# Patient Record
Sex: Male | Born: 1975 | Race: Black or African American | Hispanic: No | Marital: Married | Smoking: Current every day smoker
Health system: Southern US, Community
[De-identification: ages and names within clinical notes are randomized; demographics above are authoritative.]

## PROBLEM LIST (undated history)

## (undated) DIAGNOSIS — I10 Essential (primary) hypertension: Secondary | ICD-10-CM

## (undated) HISTORY — PX: KNEE SURGERY: SHX244

---

## 2001-04-27 ENCOUNTER — Emergency Department (HOSPITAL_COMMUNITY): Admission: EM | Admit: 2001-04-27 | Discharge: 2001-04-27 | Payer: Self-pay | Admitting: Emergency Medicine

## 2001-04-28 ENCOUNTER — Encounter: Payer: Self-pay | Admitting: Emergency Medicine

## 2007-04-29 ENCOUNTER — Emergency Department (HOSPITAL_COMMUNITY): Admission: EM | Admit: 2007-04-29 | Discharge: 2007-04-29 | Payer: Self-pay | Admitting: Emergency Medicine

## 2008-11-03 ENCOUNTER — Emergency Department (HOSPITAL_COMMUNITY): Admission: EM | Admit: 2008-11-03 | Discharge: 2008-11-03 | Payer: Self-pay | Admitting: Emergency Medicine

## 2009-07-28 ENCOUNTER — Emergency Department (HOSPITAL_COMMUNITY): Admission: EM | Admit: 2009-07-28 | Discharge: 2009-07-28 | Payer: Self-pay | Admitting: Emergency Medicine

## 2009-10-06 ENCOUNTER — Emergency Department (HOSPITAL_COMMUNITY): Admission: EM | Admit: 2009-10-06 | Discharge: 2009-10-06 | Payer: Self-pay | Admitting: Emergency Medicine

## 2010-10-21 LAB — COMPREHENSIVE METABOLIC PANEL
ALT: 17 U/L (ref 0–53)
AST: 23 U/L (ref 0–37)
Albumin: 4.2 g/dL (ref 3.5–5.2)
Alkaline Phosphatase: 61 U/L (ref 39–117)
BUN: 8 mg/dL (ref 6–23)
CO2: 29 mEq/L (ref 19–32)
Calcium: 9.5 mg/dL (ref 8.4–10.5)
Chloride: 101 mEq/L (ref 96–112)
Creatinine, Ser: 0.88 mg/dL (ref 0.4–1.5)
GFR calc Af Amer: >60 mL/min (ref >60)
GFR calc non Af Amer: >60 mL/min (ref >60)
Glucose, Bld: 112 mg/dL — ABNORMAL HIGH (ref 70–99)
Potassium: 4.7 mEq/L (ref 3.5–5.1)
Sodium: 139 mEq/L (ref 135–145)
Total Bilirubin: 1.6 mg/dL — ABNORMAL HIGH (ref 0.3–1.2)
Total Protein: 7.3 g/dL (ref 6.0–8.3)

## 2011-05-22 ENCOUNTER — Encounter: Payer: Self-pay | Admitting: Emergency Medicine

## 2011-05-22 ENCOUNTER — Emergency Department (HOSPITAL_COMMUNITY)
Admission: EM | Admit: 2011-05-22 | Discharge: 2011-05-22 | Disposition: A | Discharge status: Home / Self Care | Payer: Medicaid Other | Attending: Emergency Medicine | Admitting: Emergency Medicine

## 2011-05-22 DIAGNOSIS — J329 Chronic sinusitis, unspecified: Secondary | ICD-10-CM | POA: Insufficient documentation

## 2011-05-22 DIAGNOSIS — J029 Acute pharyngitis, unspecified: Secondary | ICD-10-CM | POA: Insufficient documentation

## 2011-05-22 DIAGNOSIS — F411 Generalized anxiety disorder: Secondary | ICD-10-CM | POA: Insufficient documentation

## 2011-05-22 DIAGNOSIS — J3489 Other specified disorders of nose and nasal sinuses: Secondary | ICD-10-CM | POA: Insufficient documentation

## 2011-05-22 DIAGNOSIS — F419 Anxiety disorder, unspecified: Secondary | ICD-10-CM

## 2011-05-22 MED ORDER — AZITHROMYCIN 250 MG PO TABS
500.0000 mg | ORAL_TABLET | Freq: Once | ORAL | Status: AC
  Administered 2011-05-22: 500 mg via ORAL
  Filled 2011-05-22: qty 2

## 2011-05-22 MED ORDER — AZITHROMYCIN 250 MG PO TABS: ORAL_TABLET | ORAL | Status: AC

## 2011-05-22 MED ORDER — KETOROLAC TROMETHAMINE 30 MG/ML IJ SOLN
60.0000 mg | Freq: Once | INTRAMUSCULAR | Status: AC
  Administered 2011-05-22: 60 mg via INTRAMUSCULAR

## 2011-05-22 MED ORDER — LORAZEPAM 1 MG PO TABS: 1.0000 mg | ORAL_TABLET | Freq: Three times a day (TID) | ORAL | Status: AC | PRN

## 2011-05-22 MED ORDER — OXYMETAZOLINE HCL 0.05 % NA SOLN
2.0000 | Freq: Once | NASAL | Status: AC
  Administered 2011-05-22: 2 via NASAL
  Filled 2011-05-22: qty 15

## 2011-05-22 MED ORDER — LORAZEPAM 1 MG PO TABS
1.0000 mg | ORAL_TABLET | Freq: Once | ORAL | Status: AC
  Administered 2011-05-22: 1 mg via ORAL
  Filled 2011-05-22: qty 1

## 2011-05-22 MED ORDER — KETOROLAC TROMETHAMINE 60 MG/2ML IM SOLN
INTRAMUSCULAR | Status: AC
  Filled 2011-05-22: qty 2

## 2011-05-22 NOTE — ED Provider Notes (Signed)
History     CSN: [id number] Arrival date & time: 05/22/2011  1:46 AM   First MD Initiated Contact with Patient 05/22/11 0243      No chief complaint on file.   (Consider location/radiation/quality/duration/timing/severity/associated sxs/prior treatment) HPI The patient is a 35 year old male who presents today with his mother for evaluation of sinus congestion. He's had this for the past 7 days. Patient is very anxious on presentation states that he cannot breathe. Mom immediately notifies me that the patient lost his son approximately one week ago. He's had an upper respiratory tract infections of that time. Patient is very congested appearing with erythema around the naris. He does complain of some sore throat as well. He denies cough. He has not had any fevers. Patient has tried Benadryl as well as saline nasal spray at home without relief. He's been taking Tylenol and ibuprofen as well. Patient does continuously snort up his mucus live in the room. When advised that he should blow this out the patient states that he can't because it hurts too bad. Patient denies any chest pain or shortness of breath. He is very anxious and irritable. There are no other associated or modifying factors. History reviewed. No pertinent past medical history.  History reviewed. No pertinent past surgical history.  History reviewed. No pertinent family history.  History  Substance Use Topics  . Smoking status: Not on file  . Smokeless tobacco: Not on file  . Alcohol Use: Yes     Normally does not drink, has been drinking everyday since Sat.      Review of Systems  Constitutional: Positive for appetite change and fatigue.  HENT: Positive for congestion, rhinorrhea and sinus pressure.   Respiratory: Negative.   Cardiovascular: Negative.   Gastrointestinal: Negative.   Genitourinary: Negative.   Musculoskeletal: Negative.   Skin: Negative.   Neurological: Positive for headaches.  Hematological:  Negative.   Psychiatric/Behavioral: Negative.   All other systems reviewed and are negative.    Allergies  Review of patient's allergies indicates no known allergies.  Home Medications   Current Outpatient Rx  Name Route Sig Dispense Refill  . DIPHENHYDRAMINE HCL 25 MG PO CAPS Oral Take 25 mg by mouth every 6 (six) hours as needed. Nasal Congestion     . COLD AND FLU PO Oral Take 30 mLs by mouth daily.      Marland Kitchen SALINE NASAL SPRAY 0.65 % NA SOLN Nasal Place 1 spray into the nose as needed. Nasal Congestion     . AZITHROMYCIN 250 MG PO TABS  Take 1 tab po qday for 4 days. Begin 11/11. 4 each 0  . LORAZEPAM 1 MG PO TABS Oral Take 1 tablet (1 mg total) by mouth every 8 (eight) hours as needed for anxiety. 20 tablet 0    BP 130/71  Pulse 69  Temp(Src) 97.7 F (36.5 C) (Oral)  Resp 20  SpO2 99%  Physical Exam  Nursing note and vitals reviewed. Constitutional: He is oriented to person, place, and time. He appears well-developed and well-nourished.       Emotionally upset  HENT:  Head: Normocephalic and atraumatic.  Nose: Mucosal edema and rhinorrhea present. Right sinus exhibits maxillary sinus tenderness and frontal sinus tenderness. Left sinus exhibits maxillary sinus tenderness and frontal sinus tenderness.  Mouth/Throat: Posterior oropharyngeal edema and posterior oropharyngeal erythema present.  Eyes: Conjunctivae and EOM are normal. Pupils are equal, round, and reactive to light.  Neck: Normal range of motion.  Cardiovascular: Normal rate, regular  rhythm, normal heart sounds and intact distal pulses.  Exam reveals no gallop and no friction rub.   No murmur heard. Pulmonary/Chest: Effort normal and breath sounds normal. No respiratory distress. He has no wheezes. He has no rales.  Abdominal: Soft. Bowel sounds are normal. He exhibits no distension. There is no tenderness. There is no rebound and no guarding.  Musculoskeletal: Normal range of motion.  Neurological: He is alert  and oriented to person, place, and time. No cranial nerve deficit. He exhibits normal muscle tone. Coordination normal.  Skin: Skin is warm and dry. No rash noted.  Psychiatric: His mood appears anxious.    ED Course  Procedures (including critical care time)   Labs Reviewed  RAPID STREP SCREEN   No results found.   1. Sinusitis   2. Anxiety       MDM  The patient was evaluated by myself. He was very uncomfortable and also quite anxious. Based on my initial evaluation patient did have a URI with symptoms that persisted for the past 7 days. Though he was afebrile and hemodynamically stable patient had significant amount of discomfort. A rapid strep screen of the throat was sent and this was negative. He was given 60 mg of Toradol IM. Given patient's anxiety he also agreed to receive Ativan 1 mg by mouth. He was given aspirin as well. Following this patient was able to sleep and did state that he felt better. As the symptoms have lasted for 7 days and patient does appearing comfortable he was given a prescription for azithromycin. First dose of 500 mg given here. Also in light of his son's recent death he was given a prescription for a brief course of Ativan. He was advised that he should seek counseling regarding this. He was not suicidal. Patient was discharged home in good condition with his aspirin as well as prescriptions for ibuprofen, azithromycin, and Ativan.        Cyndra Numbers, MD 05/22/11 1003

## 2011-05-22 NOTE — ED Notes (Signed)
rx x 2, pt voiced understanding to f/u with PCP and finish full abx treatment

## 2011-05-22 NOTE — ED Notes (Signed)
Pt c/o head congestion onset approx 5 days ago.  St's nose is stopped up causing him to feel like he can not breath.  St's he has been using saline gtts in nares without relief.  Pt denies cough

## 2011-10-27 ENCOUNTER — Encounter (HOSPITAL_COMMUNITY): Payer: Self-pay | Admitting: Physical Medicine and Rehabilitation

## 2011-10-27 ENCOUNTER — Emergency Department (HOSPITAL_COMMUNITY)
Admission: EM | Admit: 2011-10-27 | Discharge: 2011-10-27 | Disposition: A | Discharge status: Home / Self Care | Payer: Medicaid Other | Attending: Emergency Medicine | Admitting: Emergency Medicine

## 2011-10-27 DIAGNOSIS — T622X1A Toxic effect of other ingested (parts of) plant(s), accidental (unintentional), initial encounter: Secondary | ICD-10-CM | POA: Insufficient documentation

## 2011-10-27 DIAGNOSIS — L255 Unspecified contact dermatitis due to plants, except food: Secondary | ICD-10-CM | POA: Insufficient documentation

## 2011-10-27 DIAGNOSIS — F17200 Nicotine dependence, unspecified, uncomplicated: Secondary | ICD-10-CM | POA: Insufficient documentation

## 2011-10-27 DIAGNOSIS — L237 Allergic contact dermatitis due to plants, except food: Secondary | ICD-10-CM

## 2011-10-27 MED ORDER — HYDROXYZINE HCL 25 MG PO TABS: 25.0000 mg | ORAL_TABLET | Freq: Four times a day (QID) | ORAL | Status: AC

## 2011-10-27 MED ORDER — PREDNISONE 20 MG PO TABS: 60.0000 mg | ORAL_TABLET | Freq: Every day | ORAL | Status: AC

## 2011-10-27 MED ORDER — DEXAMETHASONE SODIUM PHOSPHATE 10 MG/ML IJ SOLN
10.0000 mg | Freq: Once | INTRAMUSCULAR | Status: AC
  Administered 2011-10-27: 10 mg via INTRAMUSCULAR
  Filled 2011-10-27: qty 1

## 2011-10-27 NOTE — ED Notes (Signed)
Pt presents to department for evaluation of rash all over body. 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 Now states small red, itching and painful bumps all over body. Concerned for poison ivy. Pt is alert and oriented x4. Airway intact. Ambulatory to triage. No signs of distress noted at the time.

## 2011-10-27 NOTE — Discharge Instructions (Signed)
Poison Ivy  Poison ivy is a inflammation of the skin (contact dermatitis) caused by touching the allergens on the leaves of the ivy plant following previous exposure to the plant. The rash usually appears 48 hours after exposure. The rash is usually bumps (papules) or blisters (vesicles) in a linear pattern. Depending on your own sensitivity, the rash may simply cause redness and itching, or it may also progress to blisters which may break open. These must be well cared for to prevent secondary bacterial (germ) infection, followed by scarring. Keep any open areas dry, clean, dressed, and covered with an antibacterial ointment if needed. The eyes may also get puffy. The puffiness is worst in the morning and gets better as the day progresses. This dermatitis usually heals without scarring, within 2 to 3 weeks without treatment.  HOME CARE INSTRUCTIONS    Thoroughly wash with soap and water as soon as you have been exposed to poison ivy. You have about one half hour to remove the plant resin before it will cause the rash. This washing will destroy the oil or antigen on the skin that is causing, or will cause, the rash. Be sure to wash under your fingernails as any plant resin there will continue to spread the rash. Do not rub skin vigorously when washing affected area. Poison ivy cannot spread if no oil from the plant remains on your body. A rash that has progressed to weeping sores will not spread the rash unless you have not washed thoroughly. It is also important to wash any clothes you have been wearing as these may carry active allergens. The rash will return if you wear the unwashed clothing, even several days later.  Avoidance of the plant in the future is the best measure. Poison ivy plant can be recognized by the number of leaves. Generally, poison ivy has three leaves with flowering branches on a single stem.   Diphenhydramine may be purchased over the counter and used as needed for itching. Do not drive with this medication if it makes you drowsy.Ask your caregiver about medication for children.  SEEK MEDICAL CARE IF:   Open sores develop.   Redness spreads beyond area of rash.   You notice purulent (pus-like) discharge.   You have increased pain.   Other signs of infection develop (such as fever).  Document Released: 06/25/2000 Document Revised: 06/17/2011 Document Reviewed: 05/14/2009  [employer] Patient Information 2012 [employer]    Poison Oak  Poison oak is an inflammation of the skin (contact dermatitis). It is caused by contact with the allergens on the leaves of the oak (toxicodendron) plants. Depending on your sensitivity, the rash may consist simply of redness and itching, or it may also progress to blisters which may break open (rupture). These must be well cared for to prevent secondary germ (bacterial) infection as these infections can lead to scarring. The eyes may also get puffy. The puffiness is worst in the morning and gets better as the day progresses. Healing is best accomplished by keeping any open areas dry, clean, covered with a bandage, and covered with an antibacterial ointment if needed. Without secondary infection, this dermatitis usually heals without scarring within 2 to 3 weeks without treatment.  HOME CARE INSTRUCTIONS   When you have been exposed to poison oak, it is very important to thoroughly wash with soap and water as soon as the exposure has been discovered. You have about one half hour to remove the plant resin before it will cause the   rash. This cleaning will quickly destroy the oil or antigen on the skin (the antigen is what causes the rash). Wash aggressively under the fingernails as any plant resin still there will continue to spread the rash. Do not rub skin vigorously when washing affected area. Poison oak cannot spread if no oil from the plant remains on your body. Rash that has progressed to weeping sores (lesions) will not spread the rash unless you have not washed thoroughly. It is also important to clean any clothes you have been wearing as they may carry active allergens which will spread the rash, even several days later.  Avoidance of the plant in the future is the best measure. Poison oak plants can be recognized by the number of leaves. Generally, poison oak has three leaves with flowering branches on a single stem.  Diphenhydramine may be purchased over the counter and used as needed for itching. Do not drive with this medication if it makes you drowsy. Ask your caregiver about medication for children.  SEEK IMMEDIATE MEDICAL CARE IF:     Open areas of the rash develop.   You notice redness extending beyond the area of the rash.   There is a pus like discharge.   There is increased pain.   Other signs of infection develop (such as fever).  Document Released: 01/02/2003 Document Revised: 06/17/2011 Document Reviewed: 05/14/2009  [employer] Patient Information 2012 [employer]

## 2011-10-27 NOTE — ED Provider Notes (Signed)
Medical screening examination/treatment/procedure(s) were performed by non-physician practitioner and as supervising physician I was immediately available for consultation/collaboration.   Benny Lennert, MD 10/27/11 [phone]

## 2011-10-27 NOTE — ED Notes (Signed)
Pt discharged home. No further questions.

## 2011-10-27 NOTE — ED Provider Notes (Signed)
History     CSN: [id number]  Arrival date & time 10/27/11  7846   First MD Initiated Contact with Patient 10/27/11 0819      8:33 AM  HPI Patient reports 3 days ago was working in the yard. Reports shortly after developed itchy rash on for head and arms. Denies fever, headache, neck pain. Over-the-counter antihistamine cream is not helping itch. Patient is a 36 y.o. male presenting with Poison Ivy. The history is provided by the patient.  Poison L[place]oyce Corners This is a new problem. The current episode started yesterday (3 days ago). The problem occurs constantly. The problem has been gradually worsening. Associated symptoms include a rash. Pertinent negatives include no chills, fever, headaches, joint swelling, nausea, neck pain or vomiting. Treatments tried: antihistamine cream. The treatment provided no relief.    No past medical history on file.  No past surgical history on file.  History reviewed. No pertinent family history.  History  Substance Use Topics  . Smoking status: Current Everyday Smoker -- 3.0 packs/day for 24 years  . Smokeless tobacco: Not on file  . Alcohol Use: Yes     Normally does not drink, has been drinking everyday since Sat.      Review of Systems  Constitutional: Negative for fever and chills.  HENT: Negative for neck pain and neck stiffness.   Respiratory: Negative for shortness of breath and wheezing.   Gastrointestinal: Negative for nausea and vomiting.  Musculoskeletal: Negative for joint swelling.  Skin: Positive for rash.  Neurological: Negative for dizziness and headaches.  All other systems reviewed and are negative.    Allergies  Review of patient's allergies indicates no known allergies.  Home Medications  No current outpatient prescriptions on file.  BP 126/90  Pulse 68  Temp(Src) 98.2 F (36.8 C) (Oral)  Resp 18  SpO2 97%  Physical Exam  Constitutional: He is oriented to person, place, and time. He appears well-developed and  well-nourished.  HENT:  Head: Normocephalic and atraumatic.  Mouth/Throat: Oropharynx is clear and moist. No oropharyngeal exudate.  Eyes: Pupils are equal, round, and reactive to light.  Cardiovascular: Normal rate, regular rhythm and intact distal pulses.  Exam reveals no gallop and no friction rub.   No murmur heard. Pulmonary/Chest: Effort normal and breath sounds normal. He has no wheezes. He has no rales. He exhibits no tenderness.  Neurological: He is alert and oriented to person, place, and time.  Skin: Skin is warm and dry. Rash (Linear erythematous vesicular lesions on his forehead and bilateral upper extremities.  ) noted. No erythema. No pallor.  Psychiatric: He has a normal mood and affect. His behavior is normal.    ED Course  Procedures   MDM  Patient given IM Decadron, and Prescription for prednisone and hydroxyzine for contact dermatitis due to poison ivy.     Thomasene Lot, PA-C 10/27/11 [phone]

## 2011-12-13 ENCOUNTER — Encounter (HOSPITAL_COMMUNITY): Payer: Self-pay | Admitting: *Deleted

## 2011-12-13 ENCOUNTER — Emergency Department (HOSPITAL_COMMUNITY)
Admission: EM | Admit: 2011-12-13 | Discharge: 2011-12-13 | Disposition: A | Discharge status: Home / Self Care | Payer: Self-pay | Attending: Emergency Medicine | Admitting: Emergency Medicine

## 2011-12-13 DIAGNOSIS — J302 Other seasonal allergic rhinitis: Secondary | ICD-10-CM

## 2011-12-13 DIAGNOSIS — J301 Allergic rhinitis due to pollen: Secondary | ICD-10-CM | POA: Insufficient documentation

## 2011-12-13 MED ORDER — DEXAMETHASONE SODIUM PHOSPHATE 4 MG/ML IJ SOLN
8.0000 mg | Freq: Once | INTRAMUSCULAR | Status: AC
  Administered 2011-12-13: 8 mg via INTRAMUSCULAR
  Filled 2011-12-13: qty 2

## 2011-12-13 MED ORDER — LORATADINE 10 MG PO TABS: 10.0000 mg | ORAL_TABLET | Freq: Every day | ORAL | Status: DC

## 2011-12-13 MED ORDER — PREDNISONE 10 MG PO TABS: 20.0000 mg | ORAL_TABLET | Freq: Every day | ORAL | Status: DC

## 2011-12-13 NOTE — Discharge Instructions (Signed)
Allergic Rhinitis  Allergic rhinitis is when the mucous membranes in the nose respond to allergens. Allergens are particles in the air that cause your body to have an allergic reaction. This causes you to release allergic antibodies. Through a chain of events, these eventually cause you to release histamine into the blood stream (hence the use of antihistamines). Although meant to be protective to the body, it is this release that causes your discomfort, such as frequent sneezing, congestion and an itchy runny nose.    CAUSES    The pollen allergens may come from grasses, trees, and weeds. This is seasonal allergic rhinitis, or "hay fever." Other allergens cause year-round allergic rhinitis (perennial allergic rhinitis) such as house dust mite allergen, pet dander and mold spores.    SYMPTOMS     Nasal stuffiness (congestion).   Runny, itchy nose with sneezing and tearing of the eyes.   There is often an itching of the mouth, eyes and ears.  It cannot be cured, but it can be controlled with medications.  DIAGNOSIS    If you are unable to determine the offending allergen, skin or blood testing may find it.  TREATMENT     Avoid the allergen.   Medications and allergy shots (immunotherapy) can help.   Hay fever may often be treated with antihistamines in pill or nasal spray forms. Antihistamines block the effects of histamine. There are over-the-counter medicines that may help with nasal congestion and swelling around the eyes. Check with your caregiver before taking or giving this medicine.  If the treatment above does not work, there are many new medications your caregiver can prescribe. Stronger medications may be used if initial measures are ineffective. Desensitizing injections can be used if medications and avoidance fails. Desensitization is when a patient is given ongoing shots until the body becomes less sensitive to the allergen. Make sure you follow up with your caregiver if problems continue.  SEEK  MEDICAL CARE IF:     You develop fever (more than 100.5 F (38.1 C).   You develop a cough that does not stop easily (persistent).   You have shortness of breath.   You start wheezing.   Symptoms interfere with normal daily activities.  Document Released: 03/23/2001 Document Revised: 06/17/2011 Document Reviewed: 10/02/2008  [employer] Patient Information 2012 [employer].

## 2011-12-13 NOTE — ED Provider Notes (Signed)
History  This chart was scribed for Loren Racer, MD by Bennett Scrape. This patient was seen in room STRE8/STRE8 and the patient's care was started at 11:12AM.  CSN: [id number]  Arrival date & time 12/13/11  4098   First MD Initiated Contact with Patient 12/13/11 1112      Chief Complaint  Patient presents with  . sinus issues      The history is provided by the patient. No language interpreter was used.    Barry Rivera is a 36 y.o. male who presents to the Emergency Department complaining of 2 days of gradual onset, gradually worsening, constant sinuses problems described as sinus pressure, sore throat and rhinorrhea described as yellow drainage. Pt states that it is hard for him to swallow but is still able to eat and drink slowly. He reports previous episodes of similar symptoms but states that those symptoms were more severe. Pt reports using nasonex yesterday without improvement. He denies fever and chills as associated symptoms. He has no h/o chronic medical conditions.  He is a current everyday smoker and occasional alcohol user.   History reviewed. No pertinent past medical history.  History reviewed. No pertinent past surgical history.  No family history on file.  History  Substance Use Topics  . Smoking status: Current Everyday Smoker -- 3.0 packs/day for 24 years  . Smokeless tobacco: Not on file  . Alcohol Use: Yes     Normally does not drink, has been drinking everyday since Sat.      Review of Systems  Constitutional: Negative for fever and chills.  HENT: Positive for sore throat, rhinorrhea (yellow drainage ), trouble swallowing and sinus pressure.   Respiratory: Negative for shortness of breath.   Gastrointestinal: Negative for nausea and vomiting.  Neurological: Negative for weakness.    Allergies  Review of patient's allergies indicates no known allergies.  Home Medications   Current Outpatient Rx  Name Route Sig Dispense Refill  .  LORATADINE 10 MG PO TABS Oral Take 1 tablet (10 mg total) by mouth daily. 30 tablet 0  . PREDNISONE 10 MG PO TABS Oral Take 2 tablets (20 mg total) by mouth daily. 10 tablet 0    Triage Vitals: BP 141/88  Temp(Src) 97.6 F (36.4 C) (Oral)  Resp 18  SpO2 99%  Physical Exam  Nursing note and vitals reviewed. Constitutional: He is oriented to person, place, and time. He appears well-developed and well-nourished. No distress.  HENT:  Head: Normocephalic and atraumatic.       No sinus precussion tenderness, mild pharynx erythema, no exudate, mild nasal cavity erythema with yellow drainage  Eyes: Conjunctivae and EOM are normal. Pupils are equal, round, and reactive to light.  Neck: Neck supple. No tracheal deviation present.  Cardiovascular: Normal rate.   Pulmonary/Chest: Effort normal. No respiratory distress.  Musculoskeletal: Normal range of motion.  Neurological: He is alert and oriented to person, place, and time.  Skin: Skin is warm and dry.  Psychiatric: He has a normal mood and affect. His behavior is normal.    ED Course  Procedures (including critical care time)  DIAGNOSTIC STUDIES: Oxygen Saturation is 99% on room air, normal by my interpretation.    COORDINATION OF CARE: 12:08PM-Discussed discharge plan of steroids with pt and pt agreed to plan.    Labs Reviewed - No data to display No results found.   1. Seasonal allergies       MDM  I personally performed the services described in this  documentation, which was scribed in my presence. The recorded information has been reviewed and considered.    Loren Racer, MD 12/13/11 1950

## 2011-12-13 NOTE — ED Notes (Signed)
PT is here with yellow drainage from nose and states hard to swallow but can drink water and can eat slowly

## 2011-12-13 NOTE — ED Notes (Signed)
States onset 2 days ago nasal congestion with yellow green drainage out of nose feels head pressure 8/10. Airway intact bilateral equal chest rise and fall.  Patient keeps tissue in nose because states feels better and drainage keeps dripping.  Family member at bedside.

## 2012-12-02 ENCOUNTER — Emergency Department (HOSPITAL_COMMUNITY)
Admission: EM | Admit: 2012-12-02 | Discharge: 2012-12-02 | Disposition: A | Discharge status: Home / Self Care | Payer: Self-pay | Attending: Emergency Medicine | Admitting: Emergency Medicine

## 2012-12-02 ENCOUNTER — Encounter (HOSPITAL_COMMUNITY): Payer: Self-pay

## 2012-12-02 DIAGNOSIS — L247 Irritant contact dermatitis due to plants, except food: Secondary | ICD-10-CM

## 2012-12-02 DIAGNOSIS — Y9289 Other specified places as the place of occurrence of the external cause: Secondary | ICD-10-CM | POA: Insufficient documentation

## 2012-12-02 DIAGNOSIS — T622X1A Toxic effect of other ingested (parts of) plant(s), accidental (unintentional), initial encounter: Secondary | ICD-10-CM | POA: Insufficient documentation

## 2012-12-02 DIAGNOSIS — L255 Unspecified contact dermatitis due to plants, except food: Secondary | ICD-10-CM | POA: Insufficient documentation

## 2012-12-02 DIAGNOSIS — Y9389 Activity, other specified: Secondary | ICD-10-CM | POA: Insufficient documentation

## 2012-12-02 DIAGNOSIS — F17200 Nicotine dependence, unspecified, uncomplicated: Secondary | ICD-10-CM | POA: Insufficient documentation

## 2012-12-02 MED ORDER — TRIAMCINOLONE ACETONIDE 0.1 % EX CREA: TOPICAL_CREAM | Freq: Two times a day (BID) | CUTANEOUS | Status: DC

## 2012-12-02 NOTE — ED Provider Notes (Signed)
History     CSN: [id number]  Arrival date & time 12/02/12  0818   First MD Initiated Contact with Patient 12/02/12 [phone]      Chief Complaint  Patient presents with  . Rash    (Consider location/radiation/quality/duration/timing/severity/associated sxs/prior treatment) HPI Pt is a 37yo male c/o pruritic rash that noticed 4 days ago, believed to be poison ivy or something similar.  Works with plants and trees during the day.  Rash began as small spot on left forearm, appeared to be insect bite.  Has tried bengay with some relief but rash continues to spread.  Had dried up in other spots. Rash is scattered over both arms, neck, chest, abdomen, and a healing spot at corner of left eye.  Denies fever, chills, n/v/d, difficulty breathing or swallowing.    History reviewed. No pertinent past medical history.  History reviewed. No pertinent past surgical history.  No family history on file.  History  Substance Use Topics  . Smoking status: Current Every Day Smoker -- 0.10 packs/day for 24 years    Types: Cigarettes  . Smokeless tobacco: Not on file  . Alcohol Use: Yes     Comment: occasionally      Review of Systems  Constitutional: Negative for fever and chills.  Eyes: Negative for photophobia, pain, discharge, redness, itching and visual disturbance.  Skin: Positive for rash.  All other systems reviewed and are negative.    Allergies  Review of patient's allergies indicates no known allergies.  Home Medications   Current Outpatient Rx  Name  Route  Sig  Dispense  Refill  . triamcinolone cream (KENALOG) 0.1 %   Topical   Apply topically 2 (two) times daily.   30 g   0     BP 130/81  Pulse 58  Temp(Src) 98.3 F (36.8 C) (Oral)  Resp 30  SpO2 99%  Physical Exam  Nursing note and vitals reviewed. Constitutional: He appears well-developed and well-nourished. No distress.  Pt sitting comfortably in exam bed, NAD  HENT:  Head: Normocephalic and atraumatic.   Mouth/Throat: Oropharynx is clear and moist. No oropharyngeal exudate.  Eyes: Conjunctivae and EOM are normal. Pupils are equal, round, and reactive to light. Right eye exhibits no discharge. Left eye exhibits no discharge. No scleral icterus.  Neck: Normal range of motion.  Cardiovascular: Normal rate, regular rhythm and normal heart sounds.   Pulmonary/Chest: Effort normal and breath sounds normal. No respiratory distress. He has no wheezes. He has no rales. He exhibits no tenderness.  Musculoskeletal: Normal range of motion.  Neurological: He is alert.  Skin: Skin is warm and dry. Rash ( vesicular rash sporatic, bilateral arms ) noted. He is not diaphoretic.  Psychiatric: He has a normal mood and affect. His behavior is normal.    ED Course  Procedures (including critical care time)  Labs Reviewed - No data to display No results found.   1. Contact dermatitis and eczema due to plant       MDM  Pt c/o severely pruritic rash composed of vesicular and linear appearance with some areas of eczema or excoriation. No signs of infection.  Small area of eczema at corner of left eye w/o eye involvement.  No mucosal, oral or pharyngeal involvement.  Pt appears well. Denies fever, n/v/d.  Appearance of rash is classic contact dermatitis from plant such as poison ivy/oak.  Will discharge home.  Advised to return if signs of infection, eye irritation, or difficulty breathing or swallowing.  Rx: triamcinolone 0.1% Vitals: unremarkable. Discharged in stable condition.                Junius Finner, PA-C 12/02/12 [phone]

## 2012-12-02 NOTE — ED Notes (Signed)
Pt works outside and noticed 4 days ago he had poison ivy or something similar and has been scratching it so it has spread.

## 2012-12-02 NOTE — ED Notes (Signed)
Pt discharged to home with family. NAD.  

## 2012-12-03 NOTE — ED Provider Notes (Signed)
Medical screening examination/treatment/procedure(s) were performed by non-physician practitioner and as supervising physician I was immediately available for consultation/collaboration.   Loren Racer, MD 12/03/12 [phone]

## 2013-06-21 ENCOUNTER — Emergency Department (HOSPITAL_COMMUNITY): Payer: Self-pay

## 2013-06-21 ENCOUNTER — Emergency Department (HOSPITAL_COMMUNITY)
Admission: EM | Admit: 2013-06-21 | Discharge: 2013-06-21 | Disposition: A | Discharge status: Home / Self Care | Payer: Self-pay | Attending: Emergency Medicine | Admitting: Emergency Medicine

## 2013-06-21 ENCOUNTER — Encounter (HOSPITAL_COMMUNITY): Payer: Self-pay | Admitting: Emergency Medicine

## 2013-06-21 DIAGNOSIS — X500XXA Overexertion from strenuous movement or load, initial encounter: Secondary | ICD-10-CM | POA: Insufficient documentation

## 2013-06-21 DIAGNOSIS — F17200 Nicotine dependence, unspecified, uncomplicated: Secondary | ICD-10-CM | POA: Insufficient documentation

## 2013-06-21 DIAGNOSIS — Y9389 Activity, other specified: Secondary | ICD-10-CM | POA: Insufficient documentation

## 2013-06-21 DIAGNOSIS — Y929 Unspecified place or not applicable: Secondary | ICD-10-CM | POA: Insufficient documentation

## 2013-06-21 DIAGNOSIS — M545 Low back pain: Secondary | ICD-10-CM

## 2013-06-21 DIAGNOSIS — IMO0002 Reserved for concepts with insufficient information to code with codable children: Secondary | ICD-10-CM | POA: Insufficient documentation

## 2013-06-21 DIAGNOSIS — Y990 Civilian activity done for income or pay: Secondary | ICD-10-CM | POA: Insufficient documentation

## 2013-06-21 LAB — URINALYSIS, ROUTINE W REFLEX MICROSCOPIC
Leukocytes, UA: NEGATIVE
Nitrite: NEGATIVE
Protein, ur: NEGATIVE mg/dL
Urobilinogen, UA: 1 mg/dL (ref 0.0–1.0)

## 2013-06-21 MED ORDER — IBUPROFEN 400 MG PO TABS
600.0000 mg | ORAL_TABLET | Freq: Once | ORAL | Status: AC
  Administered 2013-06-21: 600 mg via ORAL
  Filled 2013-06-21 (×2): qty 1

## 2013-06-21 MED ORDER — IBUPROFEN 600 MG PO TABS: 600.0000 mg | ORAL_TABLET | Freq: Three times a day (TID) | ORAL | Status: DC | PRN

## 2013-06-21 NOTE — ED Notes (Signed)
C/o low back pain x 2 weeks after lifting heavy item 50 lbs at work.

## 2013-06-21 NOTE — ED Notes (Signed)
Pain non radiating, worse with certain mvmts. Denies urinary frequency, dysuria.  Has not been taking anything OTC meds

## 2013-06-21 NOTE — ED Provider Notes (Signed)
I saw and evaluated the patient, reviewed the resident's note and I agree with the findings and plan. If applicable, I agree with the resident's interpretation of the EKG.  If applicable, I was present for critical portions of any procedures performed.  2 week history of low back pain after lifting a heavy item at work. No bowel and bladder incontinence. No fevers or vomiting. Worse on waking up this morning. 5/5 strength in bilateral lower extremities. Ankle plantar and dorsiflexion intact. Great toe extension intact bilaterally. +2 DP and PT pulses. +2 patellar reflexes bilaterally. Normal gait.   Glynn Octave, MD 06/21/13 1524

## 2013-06-21 NOTE — ED Notes (Signed)
Patient transported to X-ray 

## 2013-06-21 NOTE — ED Provider Notes (Signed)
CSN: [id number]     Arrival date & time 06/21/13  0711 History   None    Chief Complaint  Patient presents with  . Back Pain   (Consider location/radiation/quality/duration/timing/severity/associated sxs/prior Treatment) HPI Comments: Barry Rivera is a healthy 38 year old male who presents for a two week history of back pain.  Two weeks ago he lifted a 50lb weight onto a conveyer belt at work and experienced acute onset 6/10 throbbing low back pain.  He continued to work through the day.  He did not take anything for the pain and it subsided over the next few days.  Yesterday, when he woke up in the morning he felt pain and stiffness in his low back area.  He denies radiation into the legs, weakness/numbness/tingling in the legs, difficulty walking, saddle anesthesia, bowel or bladder dysfunction.  He finds it difficult to find a position of comfort but the pain does not interfere with or wake him from sleep.  The pain is worsened by movement.  He has no history of malignancy, unexplained weight loss or AAA.     History reviewed. No pertinent past medical history. Past Surgical History  Procedure Laterality Date  . Knee surgery     No family history on file. History  Substance Use Topics  . Smoking status: Current Every Day Smoker -- 0.10 packs/day for 24 years    Types: Cigarettes  . Smokeless tobacco: Not on file  . Alcohol Use: Yes     Comment: occasionally    Review of Systems spinal  Constitutional: Negative for fever, chills, activity change, appetite change and unexpected weight change.  Respiratory: Negative for shortness of breath.   Cardiovascular: Negative for chest pain.  Gastrointestinal: Negative for nausea, vomiting, abdominal pain, diarrhea and constipation.  Genitourinary: Negative for difficulty urinating.  Musculoskeletal: Positive for back pain. Negative for gait problem and myalgias.  Neurological: Negative for weakness and numbness.    Allergies  Review of  patient's allergies indicates no known allergies.  Home Medications   Current Outpatient Rx  Name  Route  Sig  Dispense  Refill  . ibuprofen (ADVIL,MOTRIN) 600 MG tablet   Oral   Take 1 tablet (600 mg total) by mouth every 8 (eight) hours as needed for mild pain or moderate pain.   30 tablet   0    BP 146/72  Pulse 66  Resp 16  Ht 5\' 10"  (1.778 m)  Wt 185 lb (83.915 kg)  BMI 26.54 kg/m2  SpO2 99% Physical Exam  Constitutional: He is oriented to person, place, and time. He appears well-developed and well-nourished. No distress.  HENT:  Head: Normocephalic and atraumatic.  Mouth/Throat: Oropharynx is clear and moist. No oropharyngeal exudate.  Eyes: EOM are normal. Pupils are equal, round, and reactive to light. No scleral icterus.  Neck: Normal range of motion. Neck supple.  Cardiovascular: Normal rate, regular rhythm, normal heart sounds and intact distal pulses.   Pulmonary/Chest: Effort normal and breath sounds normal. No respiratory distress. He has no wheezes. He has no rales.  Abdominal: Soft. Bowel sounds are normal. He exhibits no distension. There is no tenderness.  Musculoskeletal: Normal range of motion. He exhibits no edema.  Spine non-tender to palpation Low lumbar and sacral paravertebral musculature TTP  Neurological: He is alert and oriented to person, place, and time. He has normal reflexes. No cranial nerve deficit. He exhibits normal muscle tone.  Skin: Skin is warm. He is not diaphoretic.  Psychiatric: He has a  normal mood and affect. His behavior is normal.    ED Course  Procedures (including critical care time) Labs Review Labs Reviewed  URINALYSIS, ROUTINE W REFLEX MICROSCOPIC - Abnormal; Notable for the following:    Ketones, ur 15 (*)    All other components within normal limits   Imaging Review Dg Lumbar Spine Complete  06/21/2013   CLINICAL DATA:  Low back pain  EXAM: LUMBAR SPINE - COMPLETE 4+ VIEW  COMPARISON:  None.  FINDINGS: Frontal,  lateral, spot lumbosacral lateral, and bilateral oblique views were obtained. There are 5 non-rib-bearing lumbar type vertebral bodies. There is mild dextroscoliosis. No fracture or spondylolisthesis disc spaces appear intact. There is no appreciable facet arthropathy.  IMPRESSION: Scoliosis.  No fracture or appreciable arthropathy.   Electronically Signed   By: Bretta Bang M.D.   On: 06/21/2013 08:23    EKG Interpretation   None       MDM  37 year old healthy male with two week history of low back pain.    Will obtain DG lumbar spine, urinalysis.  No bowel or bladder dysfunction, saddle anesthesia, weakness or gait dysfunction making cauda equina syndrome unlikely.  No infectious symptoms, no spinal tenderness, radiation of pain, fever or confusion making spinal abscess unlikely.    DG lumbar spine - mild scoliosis; no fracture or appreciable arthropathy.  UA negative so no concern for stones, pyelo.  His pain is likely mechanical secondary to lifting a heavy object.  Short duration of symptoms and lack of neurological or infectious symptoms is reassuring.  Will suggests conservative treatment, including NSAIDS and return to work but avoid heavy lifting until he is feeling better.  He has been advised against prolonged bedrest as this may delay healing.  He has been advised to return to the ED if new symptoms appear or if his current back pain worsens or fails to resolve within the next few weeks.   Evelena Peat, DO 06/21/13 1132

## 2014-02-04 ENCOUNTER — Emergency Department (INDEPENDENT_AMBULATORY_CARE_PROVIDER_SITE_OTHER)
Admission: EM | Admit: 2014-02-04 | Discharge: 2014-02-04 | Disposition: A | Discharge status: Home / Self Care | Payer: Self-pay | Source: Home / Self Care | Attending: Family Medicine | Admitting: Family Medicine

## 2014-02-04 ENCOUNTER — Encounter (HOSPITAL_COMMUNITY): Payer: Self-pay | Admitting: Emergency Medicine

## 2014-02-04 DIAGNOSIS — H6120 Impacted cerumen, unspecified ear: Secondary | ICD-10-CM

## 2014-02-04 DIAGNOSIS — H918X9 Other specified hearing loss, unspecified ear: Secondary | ICD-10-CM

## 2014-02-04 DIAGNOSIS — H6123 Impacted cerumen, bilateral: Secondary | ICD-10-CM

## 2014-02-04 MED ORDER — NEOMYCIN-POLYMYXIN-HC 1 % OT SOLN
4.0000 [drp] | Freq: Once | OTIC | Status: AC
  Administered 2014-02-04: 4 [drp] via OTIC

## 2014-02-04 NOTE — ED Notes (Signed)
Pt  Reports  l  Earache       X  3  Days      Pt  Reports   Decreased  Hearing      And  Pressure    Pt reports  He  Used  An irrigation   Kit  And  It  Made  It  Worse

## 2014-02-04 NOTE — ED Provider Notes (Addendum)
CSN: [id number]     Arrival date & time 02/04/14  1552 History   First MD Initiated Contact with Patient 02/04/14 1647     Chief Complaint  Patient presents with  . Otalgia   (Consider location/radiation/quality/duration/timing/severity/associated sxs/prior Treatment) Patient is a 38 y.o. male presenting with ear pain. The history is provided by the patient.  Otalgia Location:  Left Quality:  Dull Severity:  Mild Onset quality:  Gradual Duration:  3 days Progression:  Waxing and waning Chronicity:  New Context comment:  Onset after irrig of left ear. Ineffective treatments: ear irrig. Associated symptoms: congestion and hearing loss   Associated symptoms: no ear discharge, no fever, no rhinorrhea and no tinnitus     History reviewed. No pertinent past medical history. Past Surgical History  Procedure Laterality Date  . Knee surgery     History reviewed. No pertinent family history. History  Substance Use Topics  . Smoking status: Current Every Day Smoker -- 0.10 packs/day for 24 years    Types: Cigarettes  . Smokeless tobacco: Not on file  . Alcohol Use: Yes     Comment: occasionally    Review of Systems  Constitutional: Negative.  Negative for fever.  HENT: Positive for congestion, ear pain and hearing loss. Negative for ear discharge, facial swelling, postnasal drip, rhinorrhea and tinnitus.     Allergies  Review of patient's allergies indicates no known allergies.  Home Medications   Prior to Admission medications   Medication Sig Start Date End Date Taking? Authorizing Provider  ibuprofen (ADVIL,MOTRIN) 600 MG tablet Take 1 tablet (600 mg total) by mouth every 8 (eight) hours as needed for mild pain or moderate pain. 06/21/13   Evelena PeatAlex Wilson, DO   BP 127/74  Pulse 56  Temp(Src) 98.6 F (37 C) (Oral)  Resp 16  Ht 5' 10.5" (1.791 m)  Wt 175 lb (79.379 kg)  BMI 24.75 kg/m2  SpO2 100% Physical Exam  Nursing note and vitals reviewed. Constitutional: He is  oriented to person, place, and time. He appears well-developed and well-nourished.  HENT:  Right Ear: Decreased hearing is noted.  Left Ear: Decreased hearing is noted.  Ears:  Mouth/Throat: Oropharynx is clear and moist.  Eyes: Pupils are equal, round, and reactive to light.  Neck: Normal range of motion. Neck supple.  Neurological: He is alert and oriented to person, place, and time.  Skin: Skin is warm and dry.    ED Course  Procedures (including critical care time) Labs Review Labs Reviewed - No data to display  Imaging Review No results found.   MDM   1. Hearing loss secondary to cerumen impaction, bilateral    Sx improved, canals and tm nl bilat after irrig., coritsporin instilled bilat.    Linna HoffJames D Yuvraj Pfeifer, MD 02/04/14 1707  Linna HoffJames D Caasi Giglia, MD 02/04/14 [phone]

## 2015-03-21 ENCOUNTER — Other Ambulatory Visit: Payer: Self-pay | Admitting: *Deleted

## 2015-07-01 IMAGING — CR DG LUMBAR SPINE COMPLETE 4+V
5 series · 5 of 5 positions shown · non-contrast
Comparison: None.

CLINICAL DATA: Low back pain

EXAM:
LUMBAR SPINE - COMPLETE 4+ VIEW

[t l-spine a.p.]
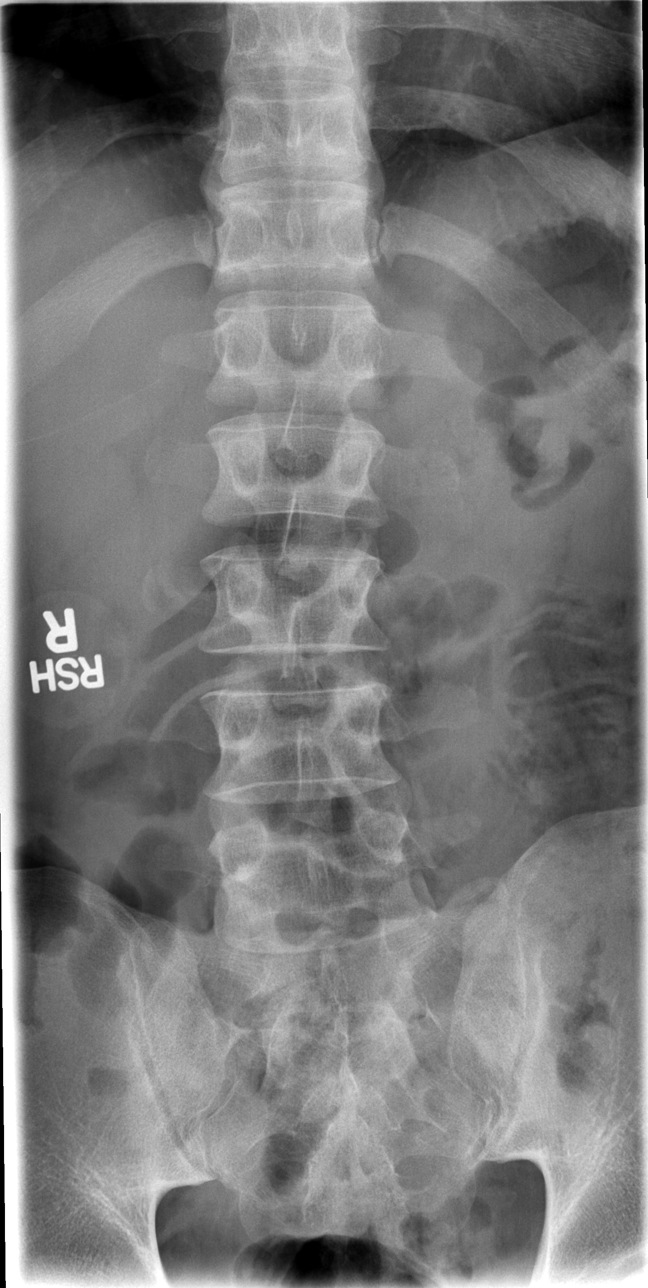

[t l-spine oblique exposure (1 of 2)]
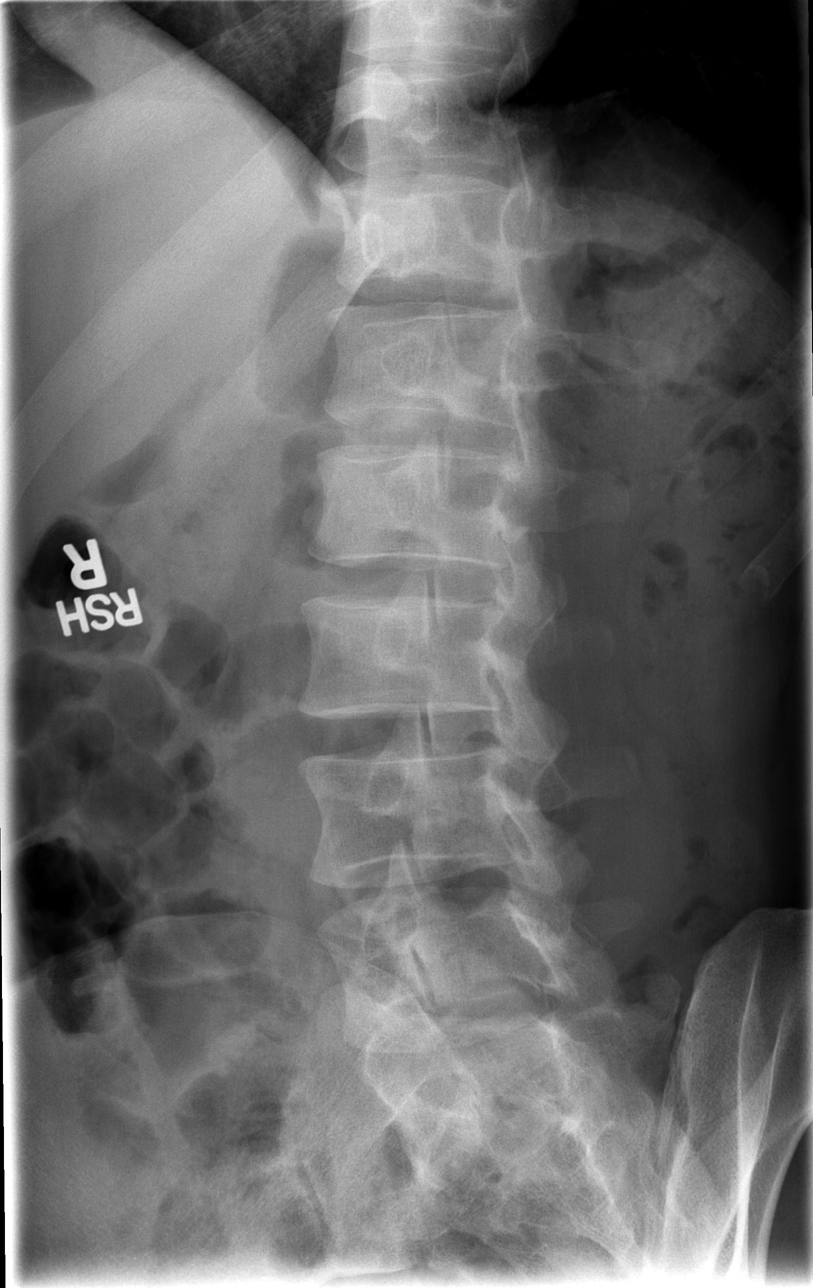

[t l-spine oblique exposure (2 of 2)]
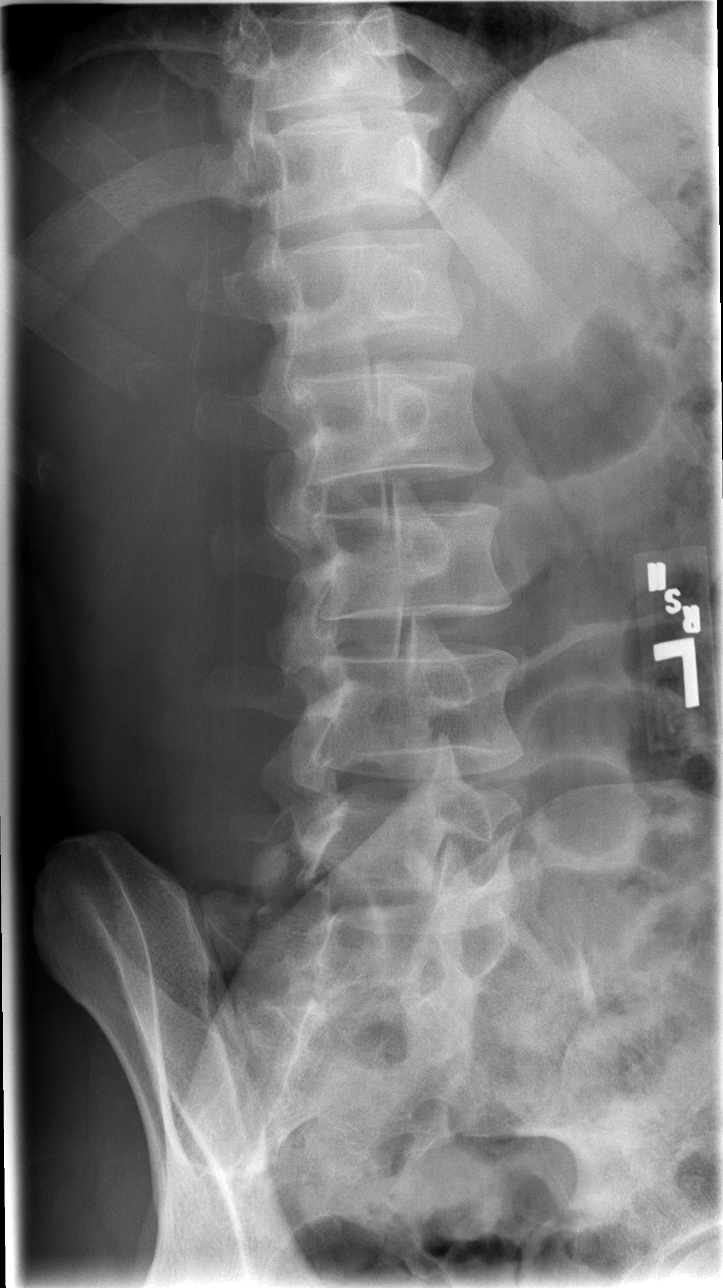

[t l-spine lat]
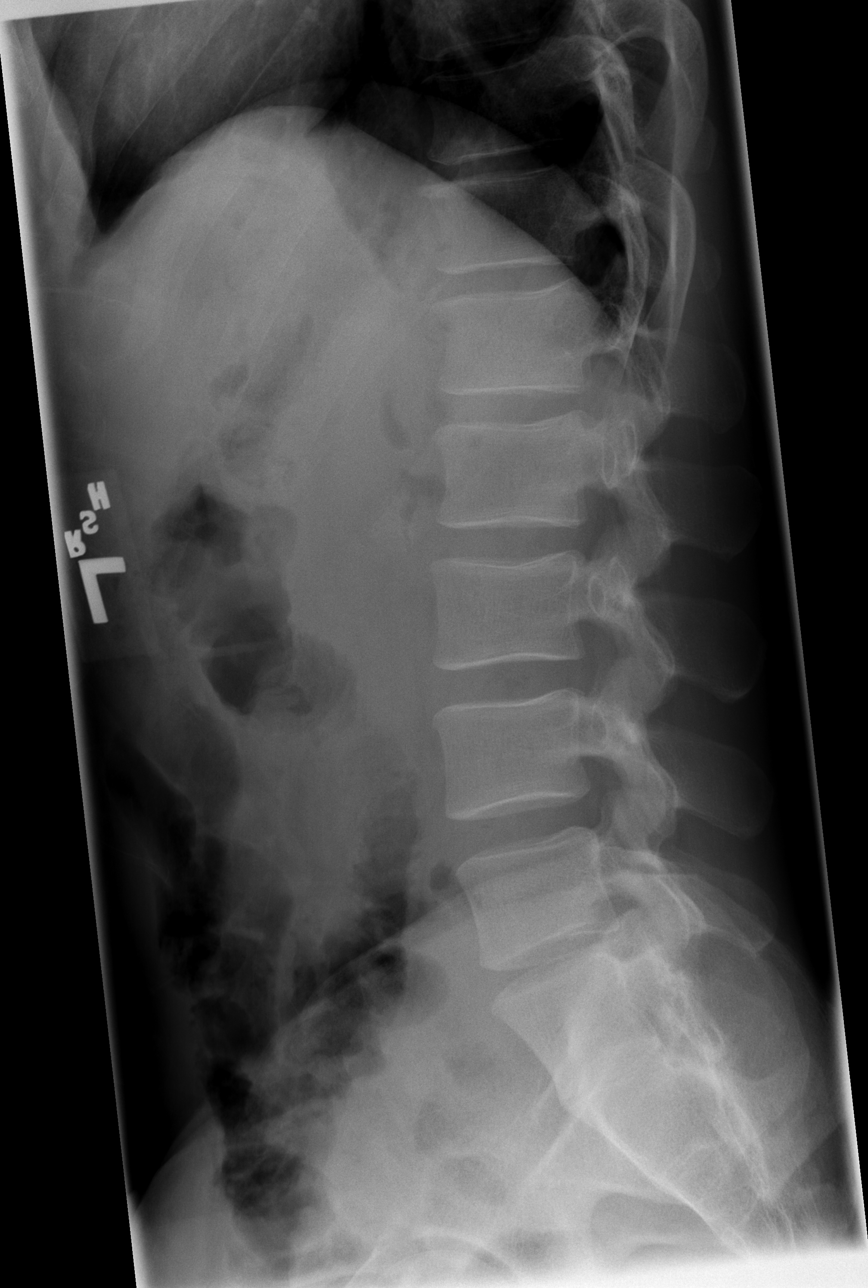

[t l-spine l5-s1 spot]
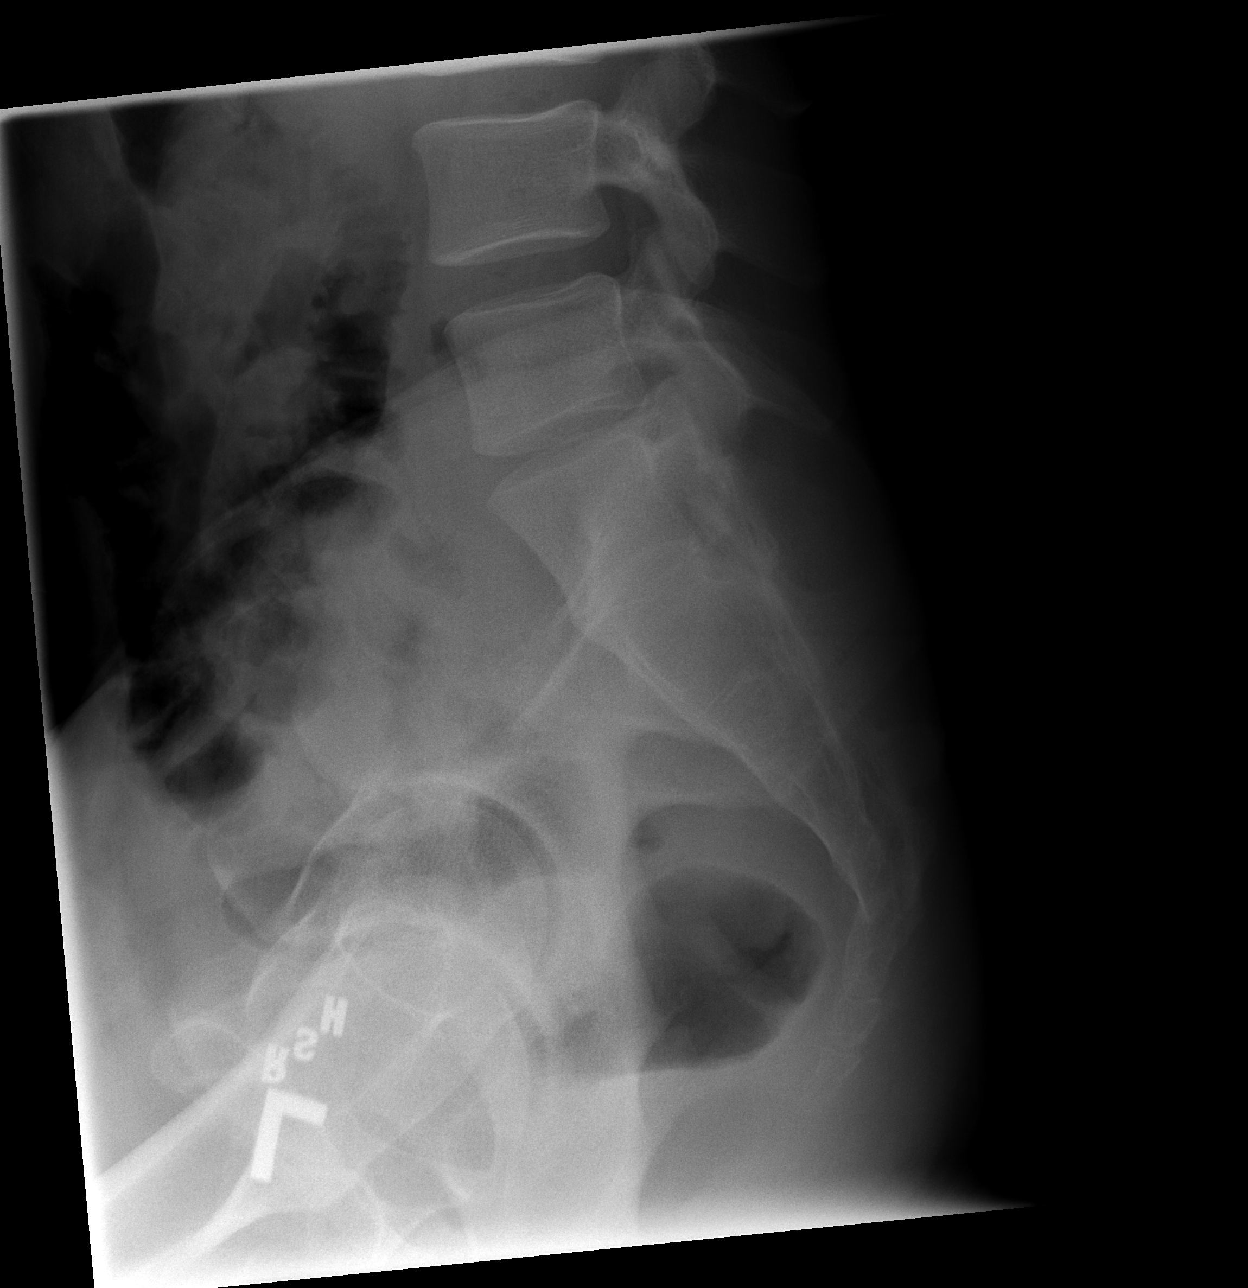

[5 of 5 positions shown; findings below may reference images not displayed]

FINDINGS: Frontal, lateral, spot lumbosacral lateral, and bilateral oblique
views were obtained. There are 5 non-rib-bearing lumbar type
vertebral bodies. There is mild dextroscoliosis. No fracture or
spondylolisthesis disc spaces appear intact. There is no appreciable
facet arthropathy.
IMPRESSION: Scoliosis.  No fracture or appreciable arthropathy.

## 2015-12-18 ENCOUNTER — Emergency Department (HOSPITAL_COMMUNITY): Payer: Self-pay

## 2015-12-18 ENCOUNTER — Encounter (HOSPITAL_COMMUNITY): Payer: Self-pay | Admitting: Emergency Medicine

## 2015-12-18 ENCOUNTER — Emergency Department (HOSPITAL_COMMUNITY)
Admission: EM | Admit: 2015-12-18 | Discharge: 2015-12-18 | Disposition: A | Discharge status: Home / Self Care | Payer: Self-pay | Attending: Emergency Medicine | Admitting: Emergency Medicine

## 2015-12-18 DIAGNOSIS — R0789 Other chest pain: Secondary | ICD-10-CM | POA: Insufficient documentation

## 2015-12-18 DIAGNOSIS — F17210 Nicotine dependence, cigarettes, uncomplicated: Secondary | ICD-10-CM | POA: Insufficient documentation

## 2015-12-18 DIAGNOSIS — R059 Cough, unspecified: Secondary | ICD-10-CM

## 2015-12-18 DIAGNOSIS — Z79899 Other long term (current) drug therapy: Secondary | ICD-10-CM | POA: Insufficient documentation

## 2015-12-18 DIAGNOSIS — Z791 Long term (current) use of non-steroidal anti-inflammatories (NSAID): Secondary | ICD-10-CM | POA: Insufficient documentation

## 2015-12-18 DIAGNOSIS — R05 Cough: Secondary | ICD-10-CM | POA: Insufficient documentation

## 2015-12-18 DIAGNOSIS — J069 Acute upper respiratory infection, unspecified: Secondary | ICD-10-CM | POA: Insufficient documentation

## 2015-12-18 LAB — CBC
HEMATOCRIT: 40.8 % (ref 39.0–52.0)
HEMOGLOBIN: 14.2 g/dL (ref 13.0–17.0)
MCH: 32 pg (ref 26.0–34.0)
MCHC: 34.8 g/dL (ref 30.0–36.0)
MCV: 91.9 fL (ref 78.0–100.0)
PLATELETS: 229 10*3/uL (ref 150–400)
RBC: 4.44 MIL/uL (ref 4.22–5.81)
RDW: 14.8 % (ref 11.5–15.5)
WBC: 9 10*3/uL (ref 4.0–10.5)

## 2015-12-18 LAB — BASIC METABOLIC PANEL
Anion gap: 6 (ref 5–15)
BUN: 10 mg/dL (ref 6–20)
CHLORIDE: 102 mmol/L (ref 101–111)
CO2: 29 mmol/L (ref 22–32)
CREATININE: 0.97 mg/dL (ref 0.61–1.24)
Calcium: 8.9 mg/dL (ref 8.9–10.3)
GFR calc non Af Amer: >60 mL/min (ref >60)
Glucose, Bld: 110 mg/dL — ABNORMAL HIGH (ref 65–99)
POTASSIUM: 3.4 mmol/L — AB (ref 3.5–5.1)
Sodium: 137 mmol/L (ref 135–145)

## 2015-12-18 LAB — I-STAT TROPONIN, ED: Troponin i, poc: 0 ng/mL (ref 0.00–0.08)

## 2015-12-18 MED ORDER — NAPROXEN 500 MG PO TABS: 500.0000 mg | ORAL_TABLET | Freq: Two times a day (BID) | ORAL | Status: DC

## 2015-12-18 MED ORDER — BENZONATATE 100 MG PO CAPS: 200.0000 mg | ORAL_CAPSULE | Freq: Two times a day (BID) | ORAL | Status: DC | PRN

## 2015-12-18 MED ORDER — OXYMETAZOLINE HCL 0.05 % NA SOLN: 1.0000 | Freq: Two times a day (BID) | NASAL | Status: DC

## 2015-12-18 NOTE — ED Provider Notes (Signed)
CSN: [id number]     Arrival date & time 12/18/15  1653 History   First MD Initiated Contact with Patient 12/18/15 1759     Chief Complaint  Patient presents with  . Chest Pain     (Consider location/radiation/quality/duration/timing/severity/associated sxs/prior Treatment) HPI   Patient is a 40 year old male in no pertinent past medical history who presents the ED with complaint of chest pain, onset 2 days. Patient reports having a constant aching pain to his mid sternal chest which she reports is worse with coughing. Patient also reports over the past 4 days he has had sinus pressure, nasal congestion, sore throat, productive cough (clear sputum). He reports his cough worsened over the past few days. Denies fever, chills, diaphoresis, HA, ear pain, neck stiffness, visual changes, wheezing, shortness of breath, palpitations, abdominal pain, N/V, numbness, tingling, weakness. Denies taking any medications PTA. Pt reports his daughters have also been sick with URI this week. Denies personal or family hx of cardiac disease. Endorses smoking 1/2 pack per day.   History reviewed. No pertinent past medical history. Past Surgical History  Procedure Laterality Date  . Knee surgery     History reviewed. No pertinent family history. Social History  Substance Use Topics  . Smoking status: Current Every Day Smoker -- 0.10 packs/day for 24 years    Types: Cigarettes  . Smokeless tobacco: None  . Alcohol Use: Yes     Comment: occasionally    Review of Systems  HENT: Positive for congestion, sinus pressure and sore throat.   Respiratory: Positive for cough.   Cardiovascular: Positive for chest pain.  All other systems reviewed and are negative.     Allergies  Review of patient's allergies indicates no known allergies.  Home Medications   Prior to Admission medications   Medication Sig Start Date End Date Taking? Authorizing Provider  benzonatate (TESSALON) 100 MG capsule Take 2 capsules  (200 mg total) by mouth 2 (two) times daily as needed for cough. 12/18/15   Barrett HenleNicole Elizabeth Ramani Riva, PA-C  ibuprofen (ADVIL,MOTRIN) 600 MG tablet Take 1 tablet (600 mg total) by mouth every 8 (eight) hours as needed for mild pain or moderate pain. Patient not taking: Reported on 12/18/2015 06/21/13   Yolanda MangesAlex M Wilson, DO  naproxen (NAPROSYN) 500 MG tablet Take 1 tablet (500 mg total) by mouth 2 (two) times daily. 12/18/15   Barrett HenleNicole Elizabeth Levada Bowersox, PA-C  oxymetazoline (AFRIN NASAL SPRAY) 0.05 % nasal spray Place 1 spray into both nostrils 2 (two) times daily. 12/18/15   Satira SarkNicole Elizabeth Aaleyah Witherow, PA-C   BP 146/98 mmHg  Pulse 58  Temp(Src) 99 F (37.2 C) (Oral)  Resp 21  SpO2 100% Physical Exam  Constitutional: He is oriented to person, place, and time. He appears well-developed and well-nourished. No distress.  HENT:  Head: Normocephalic and atraumatic.  Nose: Right sinus exhibits maxillary sinus tenderness. Right sinus exhibits no frontal sinus tenderness. Left sinus exhibits maxillary sinus tenderness. Left sinus exhibits no frontal sinus tenderness.  Mouth/Throat: Uvula is midline and mucous membranes are normal. Posterior oropharyngeal erythema present. No oropharyngeal exudate, posterior oropharyngeal edema or tonsillar abscesses.  Eyes: Conjunctivae and EOM are normal. Right eye exhibits no discharge. Left eye exhibits no discharge. No scleral icterus.  Cardiovascular: Normal rate, regular rhythm, normal heart sounds and intact distal pulses.   Pulmonary/Chest: Effort normal and breath sounds normal.  Abdominal: Soft. Bowel sounds are normal. There is no tenderness.  Musculoskeletal: He exhibits no edema.  Neurological: He is alert and  oriented to person, place, and time.  Skin: Skin is warm and dry. He is not diaphoretic.  Nursing note and vitals reviewed.   ED Course  Procedures (including critical care time) Labs Review Labs Reviewed  BASIC METABOLIC PANEL - Abnormal; Notable for the  following:    Potassium 3.4 (*)    Glucose, Bld 110 (*)    All other components within normal limits  CBC  I-STAT TROPOININ, ED    Imaging Review Dg Chest 2 View  12/18/2015  CLINICAL DATA:  Mid chest pain, congestion and breathing have a least since yesterday. EXAM: CHEST  2 VIEW COMPARISON:  None. FINDINGS: Cardiomediastinal silhouette is normal in size and configuration. Lungs are clear. Lung volumes are normal. No evidence of pneumonia. No pleural effusion. No pneumothorax. Osseous and soft tissue structures about the chest are unremarkable. IMPRESSION: Lungs are clear and there is no evidence of acute cardiopulmonary abnormality Electronically Signed   By: Bary Richard M.D.   On: 12/18/2015 18:14   I have personally reviewed and evaluated these images and lab results as part of my medical decision-making.   EKG Interpretation   Date/Time:  Thursday December 18 2015 17:09:39 EDT Ventricular Rate:  58 PR Interval:  178 QRS Duration: 93 QT Interval:  391 QTC Calculation: 384 R Axis:   4 Text Interpretation:  Sinus rhythm ST elev, probable normal early repol  pattern no reciprocal changes No old tracing to compare Confirmed by  GOLDSTON MD, SCOTT [phone]) on 12/18/2015 5:56:39 PM      MDM   Final diagnoses:  URI (upper respiratory infection)  Cough  Chest wall pain    Patient presents with chest pain and also endorses having sinus pressure, nasal congestion and cough. Denies fever. VSS. Exam revealed bilateral maxillary sinus tenderness, posterior oropharynx erythema. Remaining exam unremarkable. EKG showed sinus rhythm with ST elevation likely due to normal early repolarization, no old tracing to compare. Troponin negative. Labs unremarkable. Chest x-ray negative. HEART score 1. I suspect patient's symptoms are likely due to musculoskeletal chest wall pain. Patients symptoms are consistent with URI, likely viral etiology. Discussed that antibiotics are not indicated for viral  infections. Pt will be discharged with symptomatic treatment.  Verbalizes understanding and is agreeable with plan. Advised pt to follow up with PCP. Pt is hemodynamically stable & in NAD prior to dc. Discussed strict return precautions with pt.   Satira Sark [place], [place] 12/18/15 2032  Tilden Fossa, MD 12/20/15 [phone]

## 2015-12-18 NOTE — Discharge Instructions (Signed)
Take your medications as prescribed. Please follow up with a primary care provider from the Resource Guide provided below in 5 days if your symptoms have not improved.  Please return to the Emergency Department if symptoms worsen or new onset of fever, new chest pain, difficulty breathing, coughing up blood, difficulty breathing, abdominal pain, vomiting, headache, visual changes, neck stiffness, facial swelling.

## 2015-12-18 NOTE — ED Notes (Signed)
Pt reports central CP since yesterday accompanied by intermittent SOB and dizziness since yesterday. Pt also reports chest congestion.

## 2016-10-26 ENCOUNTER — Emergency Department (HOSPITAL_COMMUNITY)
Admission: EM | Admit: 2016-10-26 | Discharge: 2016-10-26 | Disposition: A | Discharge status: Home / Self Care | Payer: Self-pay | Attending: Dermatology | Admitting: Dermatology

## 2016-10-26 ENCOUNTER — Encounter (HOSPITAL_COMMUNITY): Payer: Self-pay | Admitting: Emergency Medicine

## 2016-10-26 DIAGNOSIS — F17210 Nicotine dependence, cigarettes, uncomplicated: Secondary | ICD-10-CM | POA: Insufficient documentation

## 2016-10-26 DIAGNOSIS — Y999 Unspecified external cause status: Secondary | ICD-10-CM | POA: Insufficient documentation

## 2016-10-26 DIAGNOSIS — Y939 Activity, unspecified: Secondary | ICD-10-CM | POA: Insufficient documentation

## 2016-10-26 DIAGNOSIS — Z79899 Other long term (current) drug therapy: Secondary | ICD-10-CM | POA: Insufficient documentation

## 2016-10-26 DIAGNOSIS — S70362A Insect bite (nonvenomous), left thigh, initial encounter: Secondary | ICD-10-CM | POA: Insufficient documentation

## 2016-10-26 DIAGNOSIS — W57XXXA Bitten or stung by nonvenomous insect and other nonvenomous arthropods, initial encounter: Secondary | ICD-10-CM | POA: Insufficient documentation

## 2016-10-26 DIAGNOSIS — Z5321 Procedure and treatment not carried out due to patient leaving prior to being seen by health care provider: Secondary | ICD-10-CM | POA: Insufficient documentation

## 2016-10-26 DIAGNOSIS — Y929 Unspecified place or not applicable: Secondary | ICD-10-CM | POA: Insufficient documentation

## 2016-10-26 NOTE — ED Triage Notes (Signed)
Pt called to treatment area x 2 

## 2016-10-26 NOTE — ED Notes (Signed)
Pt called twice with no answer.

## 2016-10-26 NOTE — ED Notes (Signed)
Pt called for room again, no response

## 2016-10-26 NOTE — ED Triage Notes (Signed)
Pt with suspected bug bite to L thigh Saturday night / Sunday am while at work. Pt states burning has worsened and area has become swollen. Pt states some drainage noted yesterday. Site dry, red in triage.

## 2016-10-26 NOTE — ED Triage Notes (Signed)
Pt not in lobby when called

## 2016-10-27 ENCOUNTER — Emergency Department (HOSPITAL_COMMUNITY)
Admission: EM | Admit: 2016-10-27 | Discharge: 2016-10-27 | Disposition: A | Discharge status: Home / Self Care | Payer: Self-pay | Attending: Emergency Medicine | Admitting: Emergency Medicine

## 2016-10-27 ENCOUNTER — Encounter (HOSPITAL_COMMUNITY): Payer: Self-pay | Admitting: Emergency Medicine

## 2016-10-27 DIAGNOSIS — W57XXXA Bitten or stung by nonvenomous insect and other nonvenomous arthropods, initial encounter: Secondary | ICD-10-CM | POA: Insufficient documentation

## 2016-10-27 DIAGNOSIS — Y929 Unspecified place or not applicable: Secondary | ICD-10-CM | POA: Insufficient documentation

## 2016-10-27 DIAGNOSIS — Y939 Activity, unspecified: Secondary | ICD-10-CM | POA: Insufficient documentation

## 2016-10-27 DIAGNOSIS — Y999 Unspecified external cause status: Secondary | ICD-10-CM | POA: Insufficient documentation

## 2016-10-27 DIAGNOSIS — L03116 Cellulitis of left lower limb: Secondary | ICD-10-CM | POA: Insufficient documentation

## 2016-10-27 DIAGNOSIS — Z79899 Other long term (current) drug therapy: Secondary | ICD-10-CM | POA: Insufficient documentation

## 2016-10-27 DIAGNOSIS — F17210 Nicotine dependence, cigarettes, uncomplicated: Secondary | ICD-10-CM | POA: Insufficient documentation

## 2016-10-27 DIAGNOSIS — S70362A Insect bite (nonvenomous), left thigh, initial encounter: Secondary | ICD-10-CM | POA: Insufficient documentation

## 2016-10-27 MED ORDER — CEPHALEXIN 500 MG PO CAPS: 500.0000 mg | ORAL_CAPSULE | Freq: Once | ORAL | Status: DC

## 2016-10-27 MED ORDER — DOXYCYCLINE HYCLATE 100 MG PO TABS
100.0000 mg | ORAL_TABLET | Freq: Once | ORAL | Status: AC
  Administered 2016-10-27: 100 mg via ORAL
  Filled 2016-10-27: qty 1

## 2016-10-27 MED ORDER — KETOROLAC TROMETHAMINE 30 MG/ML IJ SOLN
30.0000 mg | Freq: Once | INTRAMUSCULAR | Status: AC
  Administered 2016-10-27: 30 mg via INTRAMUSCULAR
  Filled 2016-10-27: qty 1

## 2016-10-27 MED ORDER — TRIAMCINOLONE ACETONIDE 0.1 % EX CREA: 1.0000 "application " | TOPICAL_CREAM | Freq: Once | CUTANEOUS | 0 refills | Status: AC

## 2016-10-27 MED ORDER — DOXYCYCLINE HYCLATE 100 MG PO CAPS: 100.0000 mg | ORAL_CAPSULE | Freq: Two times a day (BID) | ORAL | 0 refills | Status: DC

## 2016-10-27 NOTE — Discharge Instructions (Signed)
Use the steroid cream to the area. Take the antibiotic twice a day until completed. If the redness or drainage worsens return to the ED. Warm compresses to the area. Motrin and Tylenol for pain. Follow-up with a primary care or return for wound recheck in 4-5 days. Your blood pressure was slightly elevated in the ED. Have your blood pressure rechecked by primary care doctor.

## 2016-10-27 NOTE — ED Notes (Signed)
Patient states he thinks he got bit by some kind of bug. Started out as a bump now it is painful and the red area is spreading up his thigh.

## 2016-10-27 NOTE — ED Provider Notes (Signed)
[facility]-EMERGENCY DEPT Provider Note   CSN: [id number] Arrival date & time: 10/27/16  0112     History   Chief Complaint Chief Complaint  Patient presents with  . Cellulitis    HPI Barry Rivera is a 41 y.o. male.  HPI 41 year old African American male with no significant past medical history presents to the ED today with complaints of insect bite to his left inner thigh. Patient states that he feels like he got bit by some type of bug approximately 4 days ago. States that there is purulent drainage when he presses on it. States that the erythema and edema has worsened. Complains of pain. Has not taken anything for the pain prior to arrival. Patient denies any associated symptoms including nausea, vomiting, fever, chills. History reviewed. No pertinent past medical history.  There are no active problems to display for this patient.   Past Surgical History:  Procedure Laterality Date  . KNEE SURGERY         Home Medications    Prior to Admission medications   Medication Sig Start Date End Date Taking? Authorizing Provider  benzonatate (TESSALON) 100 MG capsule Take 2 capsules (200 mg total) by mouth 2 (two) times daily as needed for cough. 12/18/15   Barrett Henle, PA-C  doxycycline (VIBRAMYCIN) 100 MG capsule Take 1 capsule (100 mg total) by mouth 2 (two) times daily. 10/27/16   Rise Mu, PA-C  ibuprofen (ADVIL,MOTRIN) 600 MG tablet Take 1 tablet (600 mg total) by mouth every 8 (eight) hours as needed for mild pain or moderate pain. Patient not taking: Reported on 12/18/2015 06/21/13   Yolanda Manges, DO  naproxen (NAPROSYN) 500 MG tablet Take 1 tablet (500 mg total) by mouth 2 (two) times daily. 12/18/15   Barrett Henle, PA-C  oxymetazoline (AFRIN NASAL SPRAY) 0.05 % nasal spray Place 1 spray into both nostrils 2 (two) times daily. 12/18/15   Barrett Henle, PA-C  triamcinolone cream (KENALOG) 0.1 % Apply 1 application topically once. To the area  for no more than 3 days 10/27/16 10/27/16  Rise Mu, PA-C    Family History History reviewed. No pertinent family history.  Social History Social History  Substance Use Topics  . Smoking status: Current Every Day Smoker    Packs/day: 0.10    Years: 24.00    Types: Cigarettes  . Smokeless tobacco: Never Used  . Alcohol use Yes     Comment: occasionally     Allergies   Patient has no known allergies.   Review of Systems Review of Systems  Constitutional: Negative for chills and fever.  Gastrointestinal: Negative for nausea and vomiting.  Skin: Positive for wound.  Neurological: Negative for weakness and numbness.     Physical Exam Updated Vital Signs BP 117/78 (BP Location: Right Arm)   Pulse (!) 50   Temp 98.2 F (36.8 C) (Oral)   Resp 14   SpO2 100%   Physical Exam  Constitutional: He appears well-developed and well-nourished. No distress.  Eyes: Right eye exhibits no discharge. Left eye exhibits no discharge. No scleral icterus.  Cardiovascular: Intact distal pulses.   Pulmonary/Chest: No respiratory distress.  Musculoskeletal: Normal range of motion.  Neurological: He is alert.  Skin: Skin is warm and dry. Capillary refill takes less than 2 seconds. No pallor.  Small 0.5 cm insect bite to the left inner thigh. No purulent drainage noted. Area is indurated. Surrounding erythema. No fluctuance noted.  Nursing note and vitals reviewed.  ED Treatments / Results  Labs (all labs ordered are listed, but only abnormal results are displayed) Labs Reviewed - No data to display  EKG  EKG Interpretation None       Radiology No results found.  Procedures Procedures (including critical care time)  Medications Ordered in ED Medications  ketorolac (TORADOL) 30 MG/ML injection 30 mg (30 mg Intramuscular Given 10/27/16 0325)  doxycycline (VIBRA-TABS) tablet 100 mg (100 mg Oral Given 10/27/16 0325)     Initial Impression / Assessment and Plan / ED  Course  I have reviewed the triage vital signs and the nursing notes.  Pertinent labs & imaging results that were available during my care of the patient were reviewed by me and considered in my medical decision making (see chart for details).     Patient with insect bite and cellulitis to the left inner thigh. No area of fluctuance noted. Bed ultrasound performed that showed no drainable abscess. Patient has no associated symptoms. He is afebrile. No tachycardia noted. Will start patient on this current doxycycline. Have encouraged warm compresses and Motrin and Tylenol for pain. Patient to return in 2-3 days for wound recheck or sooner if erythema and edema worsened.Pt is hemodynamically stable, in NAD, & able to ambulate in the ED. Pain has been managed & has no complaints prior to dc. Pt is comfortable with above plan and is stable for discharge at this time. All questions were answered prior to disposition. Strict return precautions for f/u to the ED were discussed.   Final Clinical Impressions(s) / ED Diagnoses   Final diagnoses:  Cellulitis of left lower extremity    New Prescriptions Discharge Medication List as of 10/27/2016  3:03 AM    START taking these medications   Details  doxycycline (VIBRAMYCIN) 100 MG capsule Take 1 capsule (100 mg total) by mouth 2 (two) times daily., Starting Wed 10/27/2016, Print    triamcinolone cream (KENALOG) 0.1 % Apply 1 application topically once. To the area for no more than 3 days, Starting Wed 10/27/2016, Print         Rise Mu, PA-C 10/27/16 1610    Tilden Fossa, MD 10/27/16 [phone]

## 2016-10-27 NOTE — ED Triage Notes (Signed)
Pt states that he has had a raised, reddened area on his L inner thigh x 4 days. Oozes when he presses on it. Alert and oriented.

## 2017-12-27 IMAGING — CR DG CHEST 2V
2 series · 2 of 2 positions shown · non-contrast
Comparison: None.

CLINICAL DATA: Mid chest pain, congestion and breathing have a
least since yesterday.

EXAM:
CHEST  2 VIEW

[w chest pa]
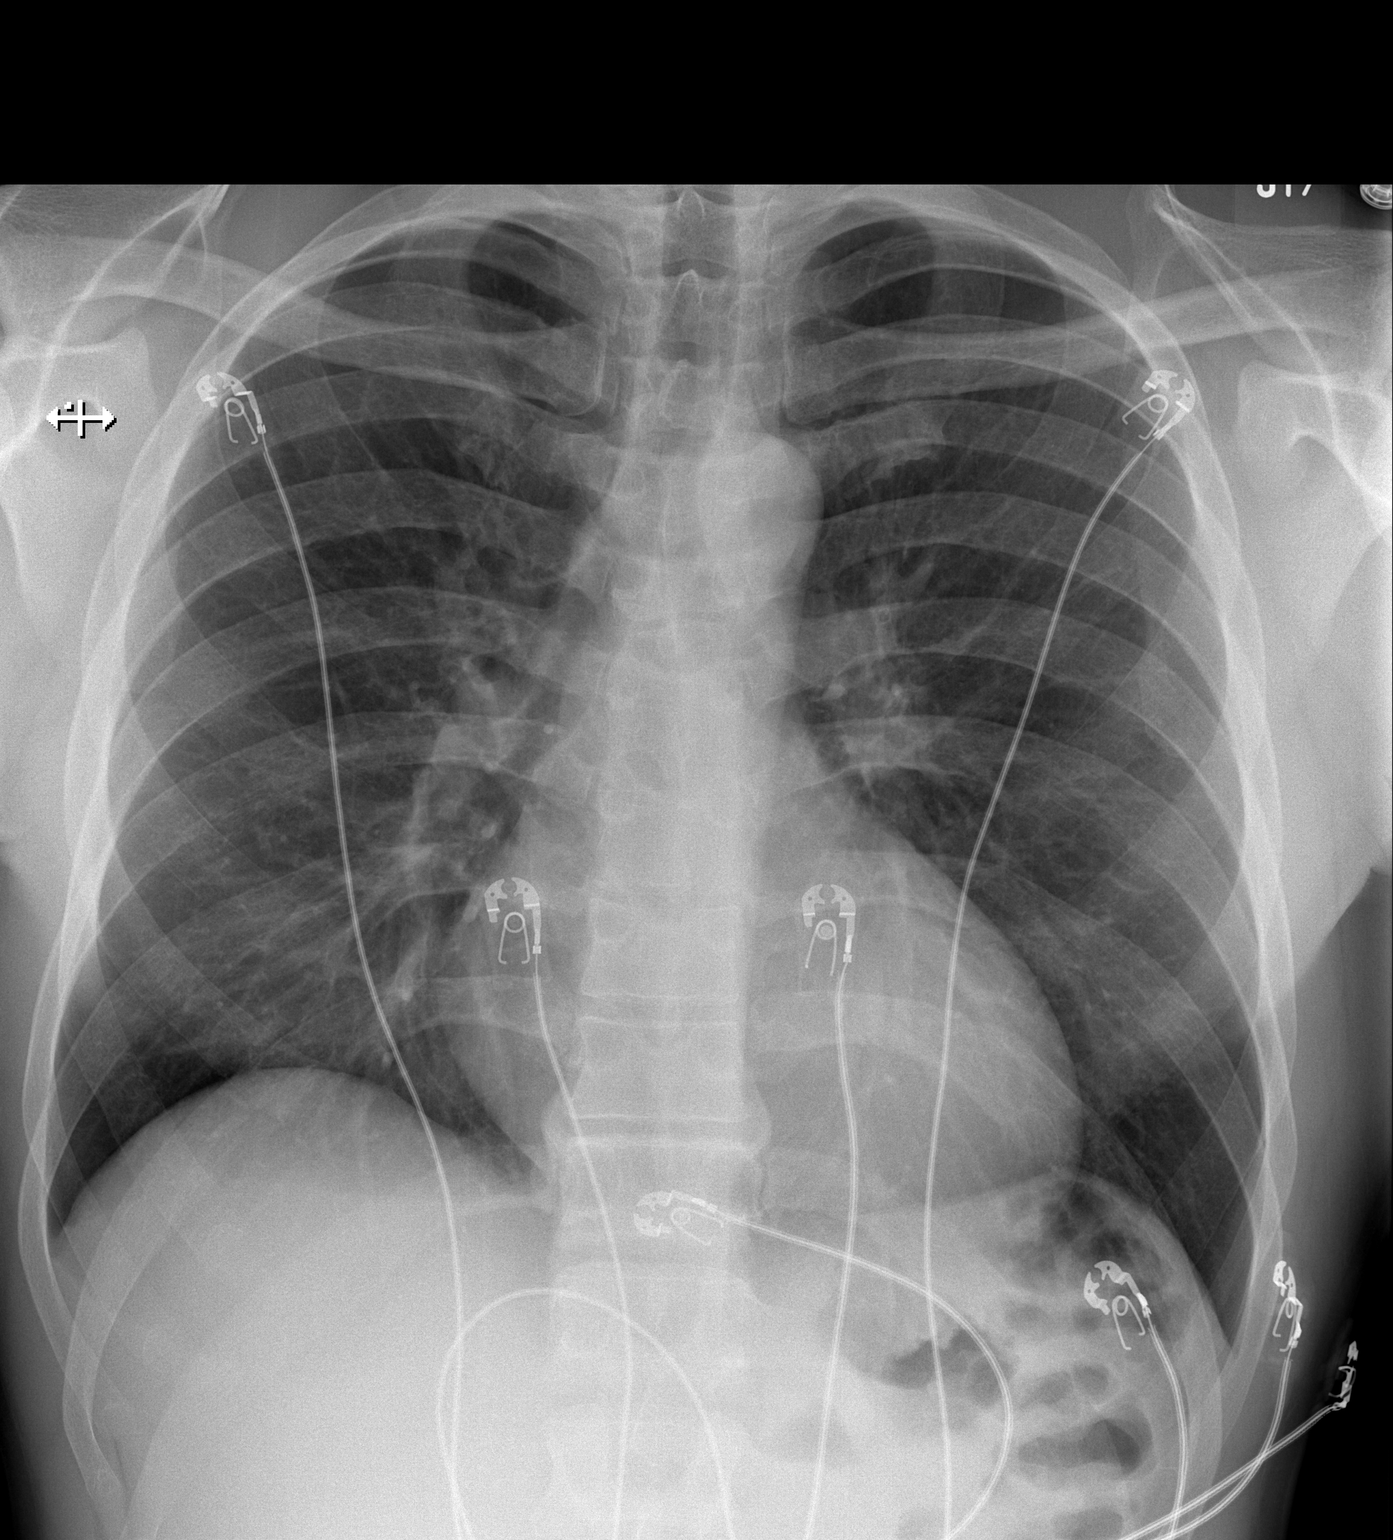

[w chest lat]
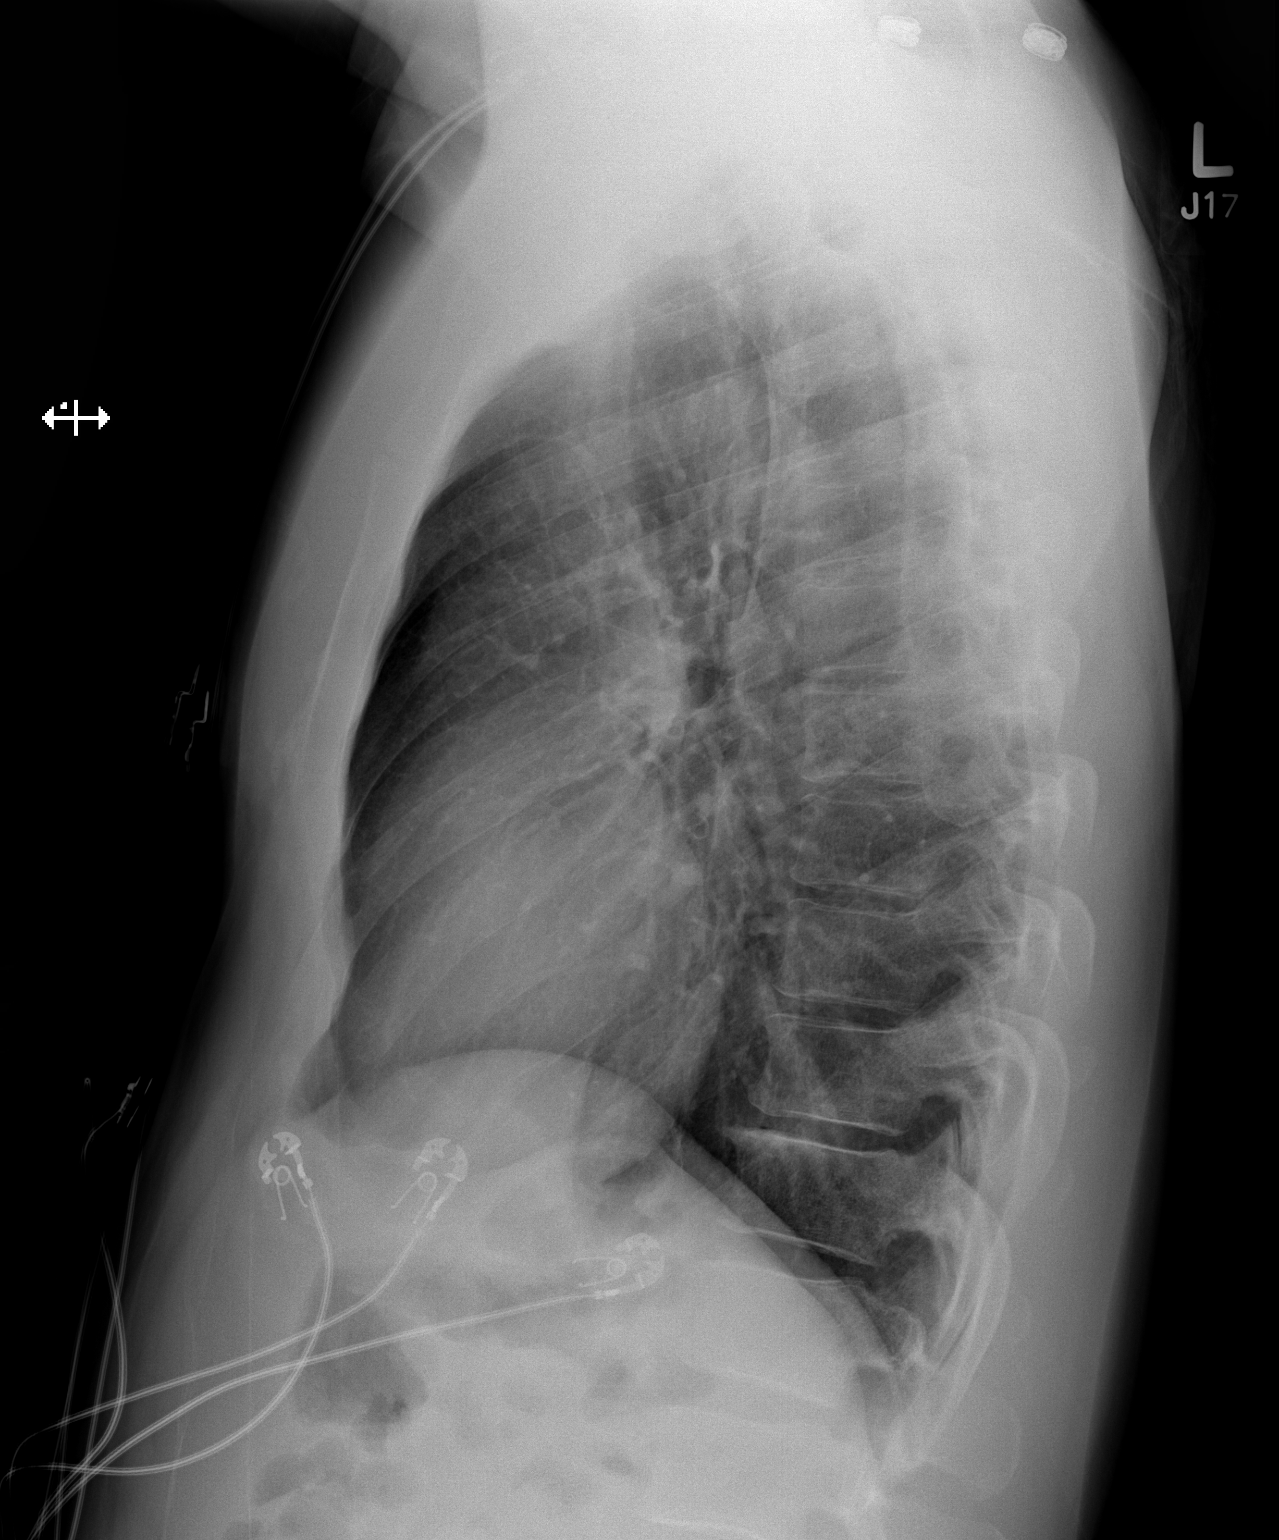

[2 of 2 positions shown; findings below may reference images not displayed]

FINDINGS: Cardiomediastinal silhouette is normal in size and configuration.
Lungs are clear. Lung volumes are normal. No evidence of pneumonia.
No pleural effusion. No pneumothorax.

Osseous and soft tissue structures about the chest are unremarkable.
IMPRESSION: Lungs are clear and there is no evidence of acute cardiopulmonary
abnormality

## 2019-01-24 ENCOUNTER — Other Ambulatory Visit: Payer: Self-pay

## 2019-01-24 ENCOUNTER — Emergency Department (HOSPITAL_COMMUNITY)
Admission: EM | Admit: 2019-01-24 | Discharge: 2019-01-24 | Disposition: A | Discharge status: Home / Self Care | Payer: Self-pay | Attending: Emergency Medicine | Admitting: Emergency Medicine

## 2019-01-24 ENCOUNTER — Encounter (HOSPITAL_COMMUNITY): Payer: Self-pay | Admitting: Emergency Medicine

## 2019-01-24 DIAGNOSIS — R0981 Nasal congestion: Secondary | ICD-10-CM | POA: Insufficient documentation

## 2019-01-24 DIAGNOSIS — Z79899 Other long term (current) drug therapy: Secondary | ICD-10-CM | POA: Insufficient documentation

## 2019-01-24 DIAGNOSIS — F17210 Nicotine dependence, cigarettes, uncomplicated: Secondary | ICD-10-CM | POA: Insufficient documentation

## 2019-01-24 MED ORDER — CETIRIZINE HCL 10 MG PO TABS: 10.0000 mg | ORAL_TABLET | Freq: Every day | ORAL | 1 refills | Status: AC

## 2019-01-24 MED ORDER — SALINE SPRAY 0.65 % NA SOLN: 1.0000 | NASAL | 0 refills | Status: AC | PRN

## 2019-01-24 MED ORDER — AMOXICILLIN-POT CLAVULANATE 875-125 MG PO TABS: 1.0000 | ORAL_TABLET | Freq: Two times a day (BID) | ORAL | 0 refills | Status: AC

## 2019-01-24 NOTE — ED Triage Notes (Signed)
Pt presents to ED from home POV. Pt c/o sinus congestion since Friday. Pt says he's tried home remedies and ibuprofen but it did not help.

## 2019-01-24 NOTE — ED Provider Notes (Signed)
[facility] EMERGENCY DEPARTMENT Provider Note   CSN: [id number] Arrival date & time: 01/24/19  3220     History   Chief Complaint Chief Complaint  Patient presents with  . Nasal Congestion    HPI Barry Rivera is a 43 y.o. male.     Patient presents to the emergency department with a chief complaint of sinus congestion.  He reports the symptoms started on Friday.  He denies any fevers or chills.  Denies any cough or sore throat.  He has tried taking Tylenol Sinus with no relief.  Denies any other associated symptoms.  The history is provided by the patient. No language interpreter was used.    History reviewed. No pertinent past medical history.  There are no active problems to display for this patient.   Past Surgical History:  Procedure Laterality Date  . KNEE SURGERY          Home Medications    Prior to Admission medications   Medication Sig Start Date End Date Taking? Authorizing Provider  benzonatate (TESSALON) 100 MG capsule Take 2 capsules (200 mg total) by mouth 2 (two) times daily as needed for cough. 12/18/15   Nona Dell, PA-C  doxycycline (VIBRAMYCIN) 100 MG capsule Take 1 capsule (100 mg total) by mouth 2 (two) times daily. 10/27/16   Doristine Devoid, PA-C  ibuprofen (ADVIL,MOTRIN) 600 MG tablet Take 1 tablet (600 mg total) by mouth every 8 (eight) hours as needed for mild pain or moderate pain. Patient not taking: Reported on 12/18/2015 06/21/13   Francesca Oman, DO  naproxen (NAPROSYN) 500 MG tablet Take 1 tablet (500 mg total) by mouth 2 (two) times daily. 12/18/15   Nona Dell, PA-C  oxymetazoline (AFRIN NASAL SPRAY) 0.05 % nasal spray Place 1 spray into both nostrils 2 (two) times daily. 12/18/15   Nona Dell, PA-C    Family History History reviewed. No pertinent family history.  Social History Social History   Tobacco Use  . Smoking status: Current Every Day Smoker    Packs/day: 0.10     Years: 24.00    Pack years: 2.40    Types: Cigarettes  . Smokeless tobacco: Never Used  Substance Use Topics  . Alcohol use: Yes    Comment: occasionally  . Drug use: Yes    Frequency: 7.0 times per week    Types: Marijuana     Allergies   Patient has no known allergies.   Review of Systems Review of Systems  All other systems reviewed and are negative.    Physical Exam Updated Vital Signs BP 132/81   Pulse 69   Temp 98.2 F (36.8 C) (Oral)   Resp 16   Ht 5\' 9"  (1.753 m)   Wt 83.9 kg   SpO2 97%   BMI 27.32 kg/m   Physical Exam Vitals signs and nursing note reviewed.  Constitutional:      Appearance: He is well-developed.  HENT:     Head: Normocephalic and atraumatic.     Comments: No sinus tenderness    Mouth/Throat:     Comments: Oropharynx is clear Eyes:     Conjunctiva/sclera: Conjunctivae normal.  Neck:     Musculoskeletal: Neck supple.  Cardiovascular:     Rate and Rhythm: Normal rate and regular rhythm.     Heart sounds: No murmur.  Pulmonary:     Effort: Pulmonary effort is normal. No respiratory distress.     Breath sounds: Normal breath  sounds.  Abdominal:     Palpations: Abdomen is soft.     Tenderness: There is no abdominal tenderness.  Skin:    General: Skin is warm and dry.  Neurological:     Mental Status: He is alert and oriented to person, place, and time.  Psychiatric:        Mood and Affect: Mood normal.        Behavior: Behavior normal.      ED Treatments / Results  Labs (all labs ordered are listed, but only abnormal results are displayed) Labs Reviewed - No data to display  EKG None  Radiology No results found.  Procedures Procedures (including critical care time)  Medications Ordered in ED Medications - No data to display   Initial Impression / Assessment and Plan / ED Course  I have reviewed the triage vital signs and the nursing notes.  Pertinent labs & imaging results that were available during my  care of the patient were reviewed by me and considered in my medical decision making (see chart for details).        Patient with sinus congestion for the past 4 days.  Afebrile.  Discussed with patient that this is likely viral, and abx wouldn't play a role.  Will give nasal saline and antihistamine.  Will give rx for augmentin, but have instructed the patient not to take it until Friday and only if worsening.  He agrees with the plan.  Final Clinical Impressions(s) / ED Diagnoses   Final diagnoses:  Nasal congestion    ED Discharge Orders    None       Roxy HorsemanBrowning, Brenly Trawick, PA-C 01/24/19 [id number]    Palumbo, April, MD 01/24/19 [phone]

## 2019-12-10 ENCOUNTER — Ambulatory Visit (HOSPITAL_COMMUNITY)
Admission: EM | Admit: 2019-12-10 | Discharge: 2019-12-10 | Disposition: A | Discharge status: Home / Self Care | Payer: Self-pay | Attending: Physician Assistant | Admitting: Physician Assistant

## 2019-12-10 ENCOUNTER — Encounter (HOSPITAL_COMMUNITY): Payer: Self-pay

## 2019-12-10 ENCOUNTER — Other Ambulatory Visit: Payer: Self-pay

## 2019-12-10 DIAGNOSIS — Z113 Encounter for screening for infections with a predominantly sexual mode of transmission: Secondary | ICD-10-CM | POA: Insufficient documentation

## 2019-12-10 HISTORY — DX: Essential (primary) hypertension: I10

## 2019-12-10 LAB — HIV ANTIBODY (ROUTINE TESTING W REFLEX): HIV Screen 4th Generation wRfx: NONREACTIVE

## 2019-12-10 NOTE — ED Triage Notes (Signed)
PT wants HIV testing. Pt not having STI symptoms at this time.

## 2019-12-10 NOTE — Discharge Instructions (Signed)
We have sent your labs, we will notify you of positive results. Otherwise these will be in your mychart.

## 2019-12-10 NOTE — ED Notes (Signed)
Patient called with no response.

## 2019-12-10 NOTE — ED Provider Notes (Signed)
[facility]    CSN: [id number] Arrival date & time: 12/10/19  1611      History   Chief Complaint Chief Complaint  Patient presents with  . STI testing    HPI Barry Rivera is a 44 y.o. male.   Patient presents for STI testing.  He reports he is asymptomatic and he is seeking testing as he is entering a new relationship.  He would like HIV, syphilis, gonorrhea, trichomonas and chlamydial testing.  He denies all symptoms.  No known exposures.     Past Medical History:  Diagnosis Date  . Hypertension     There are no problems to display for this patient.   Past Surgical History:  Procedure Laterality Date  . KNEE SURGERY         Home Medications    Prior to Admission medications   Medication Sig Start Date End Date Taking? Authorizing Provider  amoxicillin-clavulanate (AUGMENTIN) 875-125 MG tablet Take 1 tablet by mouth every 12 (twelve) hours. 01/24/19   Montine Circle, PA-C  cetirizine (ZYRTEC ALLERGY) 10 MG tablet Take 1 tablet (10 mg total) by mouth daily. 01/24/19   Montine Circle, PA-C  sodium chloride (OCEAN) 0.65 % SOLN nasal spray Place 1 spray into the nose as needed for congestion. 01/24/19   Montine Circle, PA-C    Family History No family history on file.  Social History Social History   Tobacco Use  . Smoking status: Current Every Day Smoker    Packs/day: 0.10    Years: 24.00    Pack years: 2.40    Types: Cigarettes  . Smokeless tobacco: Never Used  Substance Use Topics  . Alcohol use: Yes    Comment: occasionally  . Drug use: Yes    Frequency: 7.0 times per week    Types: Marijuana     Allergies   Patient has no known allergies.   Review of Systems Review of Systems   Physical Exam Triage Vital Signs ED Triage Vitals  Enc Vitals Group     BP 12/10/19 1714 130/84     Pulse Rate 12/10/19 1714 65     Resp 12/10/19 1714 18     Temp 12/10/19 1714 98 F (36.7 C)     Temp Source 12/10/19 1714 Oral     SpO2  12/10/19 1714 96 %     Weight 12/10/19 1715 185 lb (83.9 kg)     Height 12/10/19 1715 5\' 10"  (1.778 m)     Head Circumference --      Peak Flow --      Pain Score 12/10/19 1715 0     Pain Loc --      Pain Edu? --      Excl. in [place]? --    No data found.  Updated Vital Signs BP 130/84   Pulse 65   Temp 98 F (36.7 C) (Oral)   Resp 18   Ht 5\' 10"  (1.778 m)   Wt 185 lb (83.9 kg)   SpO2 96%   BMI 26.54 kg/m   Visual Acuity Right Eye Distance:   Left Eye Distance:   Bilateral Distance:    Right Eye Near:   Left Eye Near:    Bilateral Near:     Physical Exam Vitals and nursing note reviewed.  Constitutional:      Appearance: Normal appearance.  Genitourinary:    Comments: Deferred for self swab Neurological:     Mental Status: He is alert.  UC Treatments / Results  Labs (all labs ordered are listed, but only abnormal results are displayed) Labs Reviewed  HIV ANTIBODY (ROUTINE TESTING W REFLEX)  RPR  CYTOLOGY, (ORAL, ANAL, URETHRAL) ANCILLARY ONLY    EKG   Radiology No results found.  Procedures Procedures (including critical care time)  Medications Ordered in UC Medications - No data to display  Initial Impression / Assessment and Plan / UC Course  I have reviewed the triage vital signs and the nursing notes.  Pertinent labs & imaging results that were available during my care of the patient were reviewed by me and considered in my medical decision making (see chart for details).     Screen for STD Patient is a 44 year old male presenting for STD screening.  Asymptomatic.  Labs sent for HIV, syphilis, gonorrhea, chlamydia, trichomonas. Final Clinical Impressions(s) / UC Diagnoses   Final diagnoses:  Screen for STD (sexually transmitted disease)     Discharge Instructions     We have sent your labs, we will notify you of positive results. Otherwise these will be in your mychart.      ED Prescriptions    None     PDMP not  reviewed this encounter.   Hermelinda Medicus, PA-C 12/10/19 1749

## 2019-12-11 LAB — RPR: RPR Ser Ql: NONREACTIVE

## 2019-12-12 LAB — CYTOLOGY, (ORAL, ANAL, URETHRAL) ANCILLARY ONLY
Chlamydia: NEGATIVE
Comment: NEGATIVE
Comment: NEGATIVE
Comment: NORMAL
Neisseria Gonorrhea: NEGATIVE
Trichomonas: NEGATIVE
